# Patient Record
Sex: Female | Born: 1955 | Race: White | Hispanic: No | Smoking: Former smoker
Health system: Southern US, Community
[De-identification: ages and names within clinical notes are randomized; demographics above are authoritative.]

## PROBLEM LIST (undated history)

## (undated) DIAGNOSIS — F32A Depression, unspecified: Secondary | ICD-10-CM

## (undated) DIAGNOSIS — I8390 Asymptomatic varicose veins of unspecified lower extremity: Secondary | ICD-10-CM

## (undated) DIAGNOSIS — I10 Essential (primary) hypertension: Secondary | ICD-10-CM

## (undated) DIAGNOSIS — R569 Unspecified convulsions: Secondary | ICD-10-CM

## (undated) DIAGNOSIS — E785 Hyperlipidemia, unspecified: Secondary | ICD-10-CM

## (undated) DIAGNOSIS — F329 Major depressive disorder, single episode, unspecified: Secondary | ICD-10-CM

## (undated) DIAGNOSIS — E119 Type 2 diabetes mellitus without complications: Secondary | ICD-10-CM

## (undated) DIAGNOSIS — Z720 Tobacco use: Secondary | ICD-10-CM

## (undated) DIAGNOSIS — I639 Cerebral infarction, unspecified: Secondary | ICD-10-CM

## (undated) DIAGNOSIS — F419 Anxiety disorder, unspecified: Secondary | ICD-10-CM

## (undated) DIAGNOSIS — C539 Malignant neoplasm of cervix uteri, unspecified: Secondary | ICD-10-CM

## (undated) HISTORY — PX: PARTIAL HYSTERECTOMY: SHX80

## (undated) HISTORY — DX: Malignant neoplasm of cervix uteri, unspecified: C53.9

## (undated) HISTORY — DX: Type 2 diabetes mellitus without complications: E11.9

## (undated) HISTORY — DX: Anxiety disorder, unspecified: F41.9

## (undated) HISTORY — PX: ADENOIDECTOMY: SUR15

## (undated) HISTORY — PX: VEIN LIGATION AND STRIPPING: SHX2653

## (undated) HISTORY — PX: MOUTH SURGERY: SHX715

## (undated) HISTORY — DX: Essential (primary) hypertension: I10

## (undated) HISTORY — PX: TONSILLECTOMY: SUR1361

---

## 2002-10-21 ENCOUNTER — Encounter: Payer: Self-pay | Admitting: Pediatrics

## 2002-10-21 ENCOUNTER — Inpatient Hospital Stay (HOSPITAL_COMMUNITY): Admission: EM | Admit: 2002-10-21 | Discharge: 2002-10-27 | Payer: Self-pay

## 2002-10-27 ENCOUNTER — Inpatient Hospital Stay (HOSPITAL_COMMUNITY): Admission: AD | Admit: 2002-10-27 | Discharge: 2002-12-27 | Payer: Self-pay | Admitting: Neurology

## 2002-11-02 ENCOUNTER — Encounter: Payer: Self-pay | Admitting: Physical Medicine & Rehabilitation

## 2002-11-16 ENCOUNTER — Encounter: Payer: Self-pay | Admitting: Physical Medicine & Rehabilitation

## 2002-11-23 ENCOUNTER — Encounter: Payer: Self-pay | Admitting: Neurology

## 2002-11-24 ENCOUNTER — Encounter: Payer: Self-pay | Admitting: Physical Medicine & Rehabilitation

## 2002-11-29 ENCOUNTER — Encounter: Payer: Self-pay | Admitting: Neurology

## 2002-12-06 ENCOUNTER — Encounter: Payer: Self-pay | Admitting: Physical Medicine & Rehabilitation

## 2002-12-26 ENCOUNTER — Encounter: Payer: Self-pay | Admitting: Physical Medicine & Rehabilitation

## 2003-01-17 ENCOUNTER — Encounter (HOSPITAL_BASED_OUTPATIENT_CLINIC_OR_DEPARTMENT_OTHER): Payer: Self-pay | Admitting: Internal Medicine

## 2003-01-17 ENCOUNTER — Ambulatory Visit (HOSPITAL_COMMUNITY): Admission: RE | Admit: 2003-01-17 | Discharge: 2003-01-17 | Payer: Self-pay | Admitting: Internal Medicine

## 2012-02-28 ENCOUNTER — Inpatient Hospital Stay (HOSPITAL_COMMUNITY)
Admission: EM | Admit: 2012-02-28 | Discharge: 2012-03-01 | DRG: 069 | Disposition: A | Discharge status: Home / Self Care | Payer: PRIVATE HEALTH INSURANCE | Attending: Internal Medicine | Admitting: Internal Medicine

## 2012-02-28 ENCOUNTER — Encounter (HOSPITAL_COMMUNITY): Payer: Self-pay | Admitting: Emergency Medicine

## 2012-02-28 ENCOUNTER — Inpatient Hospital Stay (HOSPITAL_COMMUNITY): Payer: PRIVATE HEALTH INSURANCE

## 2012-02-28 ENCOUNTER — Emergency Department (HOSPITAL_COMMUNITY): Payer: PRIVATE HEALTH INSURANCE

## 2012-02-28 DIAGNOSIS — I8390 Asymptomatic varicose veins of unspecified lower extremity: Secondary | ICD-10-CM | POA: Insufficient documentation

## 2012-02-28 DIAGNOSIS — F3289 Other specified depressive episodes: Secondary | ICD-10-CM | POA: Diagnosis present

## 2012-02-28 DIAGNOSIS — F32A Depression, unspecified: Secondary | ICD-10-CM | POA: Diagnosis present

## 2012-02-28 DIAGNOSIS — I6340 Cerebral infarction due to embolism of unspecified cerebral artery: Secondary | ICD-10-CM

## 2012-02-28 DIAGNOSIS — F329 Major depressive disorder, single episode, unspecified: Secondary | ICD-10-CM | POA: Diagnosis present

## 2012-02-28 DIAGNOSIS — R531 Weakness: Secondary | ICD-10-CM

## 2012-02-28 DIAGNOSIS — I69998 Other sequelae following unspecified cerebrovascular disease: Secondary | ICD-10-CM

## 2012-02-28 DIAGNOSIS — IMO0002 Reserved for concepts with insufficient information to code with codable children: Secondary | ICD-10-CM | POA: Diagnosis present

## 2012-02-28 DIAGNOSIS — Z87891 Personal history of nicotine dependence: Secondary | ICD-10-CM

## 2012-02-28 DIAGNOSIS — E785 Hyperlipidemia, unspecified: Secondary | ICD-10-CM | POA: Diagnosis present

## 2012-02-28 DIAGNOSIS — R4789 Other speech disturbances: Secondary | ICD-10-CM

## 2012-02-28 DIAGNOSIS — E1165 Type 2 diabetes mellitus with hyperglycemia: Secondary | ICD-10-CM | POA: Diagnosis present

## 2012-02-28 DIAGNOSIS — E119 Type 2 diabetes mellitus without complications: Secondary | ICD-10-CM | POA: Diagnosis present

## 2012-02-28 DIAGNOSIS — G459 Transient cerebral ischemic attack, unspecified: Principal | ICD-10-CM | POA: Diagnosis present

## 2012-02-28 DIAGNOSIS — R209 Unspecified disturbances of skin sensation: Secondary | ICD-10-CM

## 2012-02-28 DIAGNOSIS — E7800 Pure hypercholesterolemia, unspecified: Secondary | ICD-10-CM | POA: Diagnosis present

## 2012-02-28 DIAGNOSIS — I639 Cerebral infarction, unspecified: Secondary | ICD-10-CM

## 2012-02-28 DIAGNOSIS — Z87898 Personal history of other specified conditions: Secondary | ICD-10-CM

## 2012-02-28 DIAGNOSIS — R197 Diarrhea, unspecified: Secondary | ICD-10-CM | POA: Diagnosis present

## 2012-02-28 DIAGNOSIS — E669 Obesity, unspecified: Secondary | ICD-10-CM | POA: Diagnosis present

## 2012-02-28 DIAGNOSIS — R29898 Other symptoms and signs involving the musculoskeletal system: Secondary | ICD-10-CM | POA: Diagnosis present

## 2012-02-28 DIAGNOSIS — R569 Unspecified convulsions: Secondary | ICD-10-CM | POA: Diagnosis present

## 2012-02-28 DIAGNOSIS — Z7902 Long term (current) use of antithrombotics/antiplatelets: Secondary | ICD-10-CM

## 2012-02-28 DIAGNOSIS — I10 Essential (primary) hypertension: Secondary | ICD-10-CM | POA: Diagnosis present

## 2012-02-28 HISTORY — DX: Asymptomatic varicose veins of unspecified lower extremity: I83.90

## 2012-02-28 HISTORY — DX: Tobacco use: Z72.0

## 2012-02-28 HISTORY — DX: Unspecified convulsions: R56.9

## 2012-02-28 HISTORY — DX: Cerebral infarction, unspecified: I63.9

## 2012-02-28 HISTORY — DX: Hyperlipidemia, unspecified: E78.5

## 2012-02-28 HISTORY — DX: Depression, unspecified: F32.A

## 2012-02-28 HISTORY — DX: Major depressive disorder, single episode, unspecified: F32.9

## 2012-02-28 LAB — COMPREHENSIVE METABOLIC PANEL
Alkaline Phosphatase: 120 U/L — ABNORMAL HIGH (ref 39–117)
BUN: 14 mg/dL (ref 6–23)
CO2: 24 mEq/L (ref 19–32)
Chloride: 98 mEq/L (ref 96–112)
Creatinine, Ser: 0.72 mg/dL (ref 0.50–1.10)
GFR calc Af Amer: >90 mL/min (ref >90)
GFR calc non Af Amer: >90 mL/min (ref >90)
Glucose, Bld: 142 mg/dL — ABNORMAL HIGH (ref 70–99)
Potassium: 3.4 mEq/L — ABNORMAL LOW (ref 3.5–5.1)
Total Bilirubin: 0.2 mg/dL — ABNORMAL LOW (ref 0.3–1.2)

## 2012-02-28 LAB — CBC
HCT: 41.1 % (ref 36.0–46.0)
Hemoglobin: 12.9 g/dL (ref 12.0–15.0)
MCHC: 31.4 g/dL (ref 30.0–36.0)
RBC: 4.78 MIL/uL (ref 3.87–5.11)

## 2012-02-28 LAB — POCT I-STAT, CHEM 8
BUN: 14 mg/dL (ref 6–23)
Creatinine, Ser: 0.7 mg/dL (ref 0.50–1.10)
Glucose, Bld: 146 mg/dL — ABNORMAL HIGH (ref 70–99)
Hemoglobin: 15 g/dL (ref 12.0–15.0)
TCO2: 26 mmol/L (ref 0–100)

## 2012-02-28 LAB — DIFFERENTIAL
Basophils Relative: 1 % (ref 0–1)
Lymphocytes Relative: 39 % (ref 12–46)
Lymphs Abs: 2.1 10*3/uL (ref 0.7–4.0)
Monocytes Absolute: 0.4 10*3/uL (ref 0.1–1.0)
Monocytes Relative: 8 % (ref 3–12)
Neutro Abs: 2.6 10*3/uL (ref 1.7–7.7)

## 2012-02-28 LAB — CARDIAC PANEL(CRET KIN+CKTOT+MB+TROPI)
CK, MB: 1.7 ng/mL (ref 0.3–4.0)
Total CK: 111 U/L (ref 7–177)

## 2012-02-28 LAB — CK TOTAL AND CKMB (NOT AT ARMC)
CK, MB: 1.7 ng/mL (ref 0.3–4.0)
Total CK: 120 U/L (ref 7–177)

## 2012-02-28 LAB — APTT: aPTT: 23 seconds — ABNORMAL LOW (ref 24–37)

## 2012-02-28 MED ORDER — CLOPIDOGREL BISULFATE 75 MG PO TABS
75.0000 mg | ORAL_TABLET | Freq: Every day | ORAL | Status: DC
  Administered 2012-02-29 – 2012-03-01 (×2): 75 mg via ORAL
  Filled 2012-02-28 (×3): qty 1

## 2012-02-28 MED ORDER — ENOXAPARIN SODIUM 40 MG/0.4ML ~~LOC~~ SOLN
40.0000 mg | SUBCUTANEOUS | Status: DC
  Administered 2012-02-28 – 2012-02-29 (×2): 40 mg via SUBCUTANEOUS
  Filled 2012-02-28 (×3): qty 0.4

## 2012-02-28 MED ORDER — ATORVASTATIN CALCIUM 40 MG PO TABS
40.0000 mg | ORAL_TABLET | Freq: Every day | ORAL | Status: DC
  Administered 2012-02-28: 40 mg via ORAL
  Filled 2012-02-28 (×2): qty 1

## 2012-02-28 MED ORDER — ZONISAMIDE 100 MG PO CAPS
200.0000 mg | ORAL_CAPSULE | Freq: Every day | ORAL | Status: DC
  Administered 2012-02-28 – 2012-02-29 (×2): 200 mg via ORAL
  Filled 2012-02-28 (×3): qty 2

## 2012-02-28 MED ORDER — SENNOSIDES-DOCUSATE SODIUM 8.6-50 MG PO TABS: 1.0000 | ORAL_TABLET | Freq: Every evening | ORAL | Status: DC | PRN

## 2012-02-28 MED ORDER — VENLAFAXINE HCL 75 MG PO TABS
75.0000 mg | ORAL_TABLET | Freq: Two times a day (BID) | ORAL | Status: DC
  Administered 2012-02-28 – 2012-03-01 (×4): 75 mg via ORAL
  Filled 2012-02-28 (×5): qty 1

## 2012-02-28 MED ORDER — ASPIRIN EC 81 MG PO TBEC
81.0000 mg | DELAYED_RELEASE_TABLET | Freq: Every day | ORAL | Status: DC
  Administered 2012-02-28 – 2012-02-29 (×2): 81 mg via ORAL
  Filled 2012-02-28 (×2): qty 1

## 2012-02-28 NOTE — Code Documentation (Signed)
Code stroke called at 1148, Patient arrived to [facility] via EMS at 1154, EDP seen at 1154, stroke team arrived at 1150, Patient in CT scan at 1156 lab at 1150.  As per daughter at bedside, she spoke to her at about 2200 last night, Patient woke up today around 0600 and fell while trying to get out of bed.  Patients husband called daughter who arrived to their home and realized that she had more of a slurred speech and more weakness.  Daughter called EMS NIHSS 7.  Cancelled at 1218

## 2012-02-28 NOTE — ED Notes (Signed)
Patient awaiting admit bed

## 2012-02-28 NOTE — Evaluation (Signed)
Clinical/Bedside Swallow Evaluation Patient Details  Name: Andrea Cortez MRN: [id number] Date of Birth: [birth year]  Today's Date: 02/28/2012 Time: 1600-1630 SLP Time Calculation (min): 30 min  Past Medical History:  Past Medical History  Diagnosis Date  . Stroke 2004 x 2, 2010 x 1    left slightly hemorrhagic left temporal and parietal infarct, new acute right medial thalamus, and left anterior pons --> had extended rehabilitation course // Continues to have residual right UE weakness  . Hyperlipidemia   . Depression     2/2 stroke in 2004  . Seizure 2004 after stroke    developed 2/2 stroke in 2004  . Tobacco abuse 1.5 PPD x 38 years    smoking since 6th grade; quit 2004  . Varicose vein     B/L LE   Past Surgical History:  Past Surgical History  Procedure Date  . Partial hysterectomy 1998/1999    uterus removed 2/2 cervical cancer  . Tonsillectomy     as a child   HPI:  56 y/o F admitted to [facility] ED with stroke like symptoms. Patient referred for BSE as she failed RN [employer] Screen.    Assessment / Plan / Recommendation Clinical Impression   Minimal oropharyngeal dysphagia marked by decreased sensory affecting initiation of swallow with thin liquids with intermittent s/s of penetration with larger sips . Symptoms present during evaluation more esophageal based. Recommend to proceed with regular consistency and thin liquids with strict aspiration and reflux precautions. Patient to benefit from GI consult to assess esophageal functioning. ST warranted in acute care setting for diet tolerance and to provide education to patient and caregivers on strategies to decrease risk of aspiration. Recommend full supervision with all meals as patient observed to be impulsive with decreased safety awareness.     Aspiration Risk    Moderate   Diet Recommendation Regular;Thin liquid  Full supervision with all meals Small bites and sips Effortful swallow with head down position with  liquids May have straws Eat at slow rate Clear throat intermittently throughout meal   Other  Recommendations Consider GI evaluation   Follow Up Recommendations   No further ST treatment at next level of care   Frequency and Duration min 2x/week  2 weeks        SLP Swallow Goals Patient will consume recommended diet without observed clinical signs of aspiration with: Modified independent assistance Patient will utilize recommended strategies during swallow to increase swallowing safety with: Modified independent assistance   Swallow Study Prior Functional Status   Reports of coughing with soup and reports of solids "sticking"    General Date of Onset: 02/28/12 HPI: 56 y/o F admitted to [facility] ED with stroke like symptoms. Patient referred for BSE as she failed RN [employer] Screen.  Type of Study: Bedside swallow evaluation Previous Swallow Assessment: 2004 and 2011 Diet Prior to this Study: NPO Temperature Spikes Noted: No Respiratory Status: Room air History of Intubation: No Behavior/Cognition: Alert;Cooperative;Impulsive Oral Cavity - Dentition: Dentures, top;Dentures, bottom Vision: Functional for self-feeding Patient Positioning: Upright in bed Baseline Vocal Quality: Clear Volitional Cough: Strong Volitional Swallow: Able to elicit    Oral/Motor/Sensory Function Overall Oral Motor/Sensory Function: Impaired Labial ROM: Reduced left Labial Symmetry: Abnormal symmetry left Labial Strength: Reduced Lingual ROM: Reduced left Lingual Symmetry: Abnormal symmetry left Lingual Strength: Reduced Lingual Sensation: Reduced Facial ROM: Reduced left Facial Symmetry: Left droop Facial Strength: Reduced Facial Sensation: Reduced   Ice Chips Ice chips: Within functional limits Presentation: Spoon  Thin Liquid Thin Liquid: Impaired Presentation: Cup;Straw Pharyngeal  Phase Impairments: Delayed Swallow;Decreased hyoid-laryngeal movement;Cough - Delayed    Nectar Thick  Nectar Thick Liquid: Not tested   Honey Thick Honey Thick Liquid: Not tested   Puree Puree: Impaired Pharyngeal Phase Impairments: Delayed Swallow;Decreased hyoid-laryngeal movement   Solid Solid: Impaired Pharyngeal Phase Impairments: Delayed Swallow;Decreased hyoid-laryngeal movement;Throat Clearing - Delayed   Moreen Fowler MS, CCC-SLP [phone] [facility] 02/28/2012,7:29 PM

## 2012-02-28 NOTE — H&P (Signed)
This is a Psychologist, occupational Note.  The care of the patient was discussed with Dr. Quentin Ore and the assessment and plan was formulated with their assistance.  Please see their note below for official documentation of the patient encounter.     Date: 02/28/2012  Patient name: Andrea Cortez Medical record number: [id number] Date of birth: 1956-01-10 Age: 56 y.o. Gender: female PCP: No primary provider on file.  Medical Service: Internal Medicine Teaching Service  Attending physician: Dr. Blanch Media     Internal Medicine Teaching Service Contact Information  1st Contact:  Quentin Ore Pager: [phone] 2nd Contact:  Johnette Abraham Pager: [phone] After 5 pm or weekends: 1st Contact:      Pager: [phone] 2nd Contact:      Pager: [phone]  Chief Complaint: R sided weakness / slurred speech  History of Present Illness: Patient is a 56 yo woman with PMH of stroke (2004 x 2 and 2011 x 1) with residual right face reduced sensation, right arm weakness and reduced sensation and right leg weakness and reduced sensation for which she uses a cane to ambulate at home (has wheelchair but does not use it). At baseline she usually has some dysarthria and right sided facial droop. She was in her usual state of health until last night when she went to bed and upon awaking this morning from her dog barking she attempted to get out of bed. Her usual routine is to swing her legs off the side of the bed and wait a second before standing up. She does not note that her legs felt any different this morning as she swung them off the bed, however her memory is somewhat impaired about this morning. Upon standing up her legs immediately gave out from under her and she face-planted to the floor. Her husband was immediately at her side, however she told him to leave her be and finally after about 30 minutes he and their daughter picked her up off the floor. The daughter noticed that her speech was worse than  usual and that there was more pronounced facial droop and called EMS. She noted some blurry vision that has resolved almost completely. She had some dizziness after her fall and currently in the ED she is not dizzy however she says she knows she would be if she were to stand. She also admits to having constant diarrhea at least two loose, brown movements a day for "a long time". Notably, she has been up and used the bathroom with assistance while in the ED. She denies any hearing or olfactory loss, nausea, coughs, fevers, chills, or night sweats.  Meds: Home meds are unclear as to what she is actually taking due to inconsistenices in prescription refills as well as her daughter reporting that she finds pills lying around the house. But list includes: Fish oil daily Baby ASA daily Plavix 75 mg BID Zonisamide 100 mg qhs Venlafaxine 75 mg BID Niacin (flush free) 500 mg daily Torsemide 20 mg daily (has been on it for 3 months, then will switch back to lasix 20 mg daily) Vitamin B12 shot monthly (last dose last week) Vitamin B12 tablet 500 mcg daily Crestor 20 mg daily Fenofibrate 160 mg daily Suphedrine PE (chlorpheniramine 4mg /phenylephine 10 mg) BIDPRN  Allergies: Allergies as of 02/28/2012 - Review Complete 02/28/2012  Allergen Reaction Noted  . Iron Swelling 02/28/2012  . Ivp dye (iodinated diagnostic agents) Swelling/ itching 02/28/2012  . Niacin and related Extreme flushing 02/28/2012   Past Medical History  Diagnosis Date  . Stroke 2004 x 2, 2010    left slightly hemorrhagic left temporal and parietal infarct (left MCA), new acute right medial thalamus, and left anterior pons --> had extended rehabilitation course // Continues to have residual right UE weakness  . Hyperlipidemia   . Depression     2/2 stroke in 2004  . Seizure 2004 after stroke    developed 2/2 stroke in 2004  . Tobacco abuse 1.5 PPD x 38 years    smoking since 6th grade; quit 2004   Past Surgical History    Procedure Date  . Partial hysterectomy 1998/1999    uterus removed 2/2 cervical cancer  . Tonsillectomy     as a child   Family History  Problem Relation Age of Onset  . Cancer Mother     lung cancer, died at 56  . Lung disease Father     1 lung removed, death (u/k cause)  . Heart attack Brother     massive MI causing death  . HIV Brother     died from AIDS  . Emphysema Brother     alive  . Other Daughter     hypochondriac   History   Social History  . Marital Status: Married    Spouse Name: N/A    Number of Children: N/A  . Years of Education: Graduated college with degree in accounting   Occupational History  . Accountant--on diasbility since 2004    Social History Main Topics  . Smoking status: Former Smoker -- 1.5 packs/day for 38 years    Types: Cigarettes    Quit date: 01/24/2012  . Smokeless tobacco: Never Used   Comment: started smoking in 6th grade  . Alcohol Use: No  . Drug Use: No  . Sexually Active: Not on file   Other Topics Concern  . Not on file   Social History Narrative  . No narrative on file    Review of Systems: Pertinent items are noted in HPI.  Physical Exam: Blood pressure 130/84, pulse 96, temperature 97.6 F (36.4 C), temperature source Oral, resp. rate 15, SpO2 95.00%. General appearance: alert, cooperative, no distress and slowed mentation Head: Normocephalic, without obvious abnormality, atraumatic Eyes: conjunctivae/corneas clear. PERRL, EOM's intact. Fundi benign. Ears: normal TM's and external ear canals both ears Nose: Nares normal. Septum midline. Mucosa normal. No drainage or sinus tenderness. Throat: lips, mucosa, and tongue normal; teeth and gums normal Neck: no adenopathy, no carotid bruit, no JVD, supple, symmetrical, trachea midline and thyroid not enlarged, symmetric, no tenderness/mass/nodules Back: symmetric, no curvature. ROM normal. No CVA tenderness. Lungs: rales RLL Heart: regular rate and rhythm, S1, S2  normal, no murmur, click, rub or gallop Abdomen: soft, non-tender; bowel sounds normal; no masses,  no organomegaly Extremities: edema some edema B/L LE and varicose veins noted; notable R arm, R leg weakness Pulses: 2+ and symmetric Skin: Skin color, texture, turgor normal. No rashes or lesions; notable R arm/R leg decreased sensation to touch Neurologic: Mental status: Alert, oriented, thought content appropriate Cranial nerves:  I: smell Not tested  II: visual acuity  OS: blurry    OD: blurry  II: visual fields Full to confrontation  II: pupils Equal, round, reactive to light  III,VII: ptosis None  III,IV,VI: extraocular muscles  Full ROM  V: mastication Normal  V: facial light touch sensation  Normal  V,VII: corneal reflex  Present  VII: facial muscle function - upper  R 3/5, L 5/5  VII: facial  muscle function - lower R 3/5, L 5/5  VIII: hearing Not tested  IX: soft palate elevation  Normal  IX,X: gag reflex Not tested  XI: trapezius strength  R 3/5, L 5/5  XI: sternocleidomastoid strength 5/5  XI: neck flexion strength  5/5  XII: tongue strength  Normal   Lab results: Basic Metabolic Panel:  Basename 02/28/12 1207 02/28/12 1158  NA 139 136  K 4.0 3.4*  CL 100 98  CO2 -- 24  GLUCOSE 146* 142*  BUN 14 14  CREATININE 0.70 0.72  CALCIUM -- 9.3  MG -- --  PHOS -- --   Liver Function Tests:  Basename 02/28/12 1158  AST 29  ALT 23  ALKPHOS 120*  BILITOT 0.2*  PROT 7.5  ALBUMIN 3.9   CBC:  Basename 02/28/12 1207 02/28/12 1158  WBC -- 5.4  NEUTROABS -- 2.6  HGB 15.0 12.9  HCT 44.0 41.1  MCV -- 86.0  PLT -- 312   Cardiac Enzymes:  Basename 02/28/12 1158  CKTOTAL 120  CKMB 1.7  CKMBINDEX --  TROPONINI <0.30   CBG:  Basename 02/28/12 1226  GLUCAP 132*  Coagulation:  Basename 02/28/12 1158  LABPROT 12.8  INR 0.94   Imaging results:  Ct Head Wo Contrast  02/28/2012  *RADIOLOGY REPORT*  Clinical Data: Left-sided weakness and slurred speech.  CT  HEAD WITHOUT CONTRAST  Technique:  Contiguous axial images were obtained from the base of the skull through the vertex without contrast.  Comparison: 07/03/2011  Findings: There is no acute intracranial hemorrhage, infarction, or mass lesion.  Old stroke in the right thalamus as well as an extensive old stroke in the left middle cerebral artery distribution with secondary encephalomalacia.  No acute abnormalities.  No osseous abnormalities of significance.  IMPRESSION: Old strokes.  No acute abnormalities.  Original Report Authenticated By: Gwynn Burly, M.D.   Mr Angiogram Head Wo Contrast  02/28/2012  *RADIOLOGY REPORT*  Clinical Data:  A aphasia.  Weakness.  MRI HEAD WITHOUT CONTRAST MRA HEAD WITHOUT CONTRAST  Technique:  Multiplanar, multiecho pulse sequences of the brain and surrounding structures were obtained without intravenous contrast. Angiographic images of the head were obtained using MRA technique without contrast.  Comparison:  Head CT same day.  Previous MRI 08/13/2009.  MRI HEAD  Findings:  Diffusion imaging does not show any acute or subacute infarction.  The brainstem is normal except for Wallerian degeneration on the left.  No focal cerebellar insult.  The cerebral hemispheres show an old lacunar infarction in the right thalamus, chronic small vessel changes within the deep white matter and an old left middle cerebral artery territory stroke that has progressed to atrophy encephalomalacia with adjacent gliosis.  No mass lesion, hemorrhage, hydrocephalus or extra-axial collection. There is flow in the major vessels at the base of the brain.  No pituitary mass.  There is mild mucosal inflammation of the right maxillary sinus.  IMPRESSION: No acute infarction.  Old left MCA territory stroke.  Chronic white matter small vessel disease.  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  MRA HEAD  Findings: Both internal carotid arteries are widely patent into the brain.  The anterior and middle cerebral vessels are  patent without proximal stenosis, aneurysm or vascular malformation. There is a missing MCA branch on the left consistent with the old infarction in that region.  Both vertebral arteries are patent to the basilar. No basilar stenosis.  Posterior circulation branch vessels appear normal.  IMPRESSION: Normal intracranial MR angiography of the  large and medium-sized vessels with exception that there is a missing the left MCA branch consistent with the old infarction in that distribution.  Original Report Authenticated By: Thomasenia Sales, M.D.   Mr Brain Wo Contrast  02/28/2012  *RADIOLOGY REPORT*  Clinical Data:  A aphasia.  Weakness.  MRI HEAD WITHOUT CONTRAST MRA HEAD WITHOUT CONTRAST  Technique:  Multiplanar, multiecho pulse sequences of the brain and surrounding structures were obtained without intravenous contrast. Angiographic images of the head were obtained using MRA technique without contrast.  Comparison:  Head CT same day.  Previous MRI 08/13/2009.  MRI HEAD  Findings:  Diffusion imaging does not show any acute or subacute infarction.  The brainstem is normal except for Wallerian degeneration on the left.  No focal cerebellar insult.  The cerebral hemispheres show an old lacunar infarction in the right thalamus, chronic small vessel changes within the deep white matter and an old left middle cerebral artery territory stroke that has progressed to atrophy encephalomalacia with adjacent gliosis.  No mass lesion, hemorrhage, hydrocephalus or extra-axial collection. There is flow in the major vessels at the base of the brain.  No pituitary mass.  There is mild mucosal inflammation of the right maxillary sinus.  IMPRESSION: No acute infarction.  Old left MCA territory stroke.  Chronic white matter small vessel disease.  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  MRA HEAD  Findings: Both internal carotid arteries are widely patent into the brain.  The anterior and middle cerebral vessels are patent without proximal stenosis,  aneurysm or vascular malformation. There is a missing MCA branch on the left consistent with the old infarction in that region.  Both vertebral arteries are patent to the basilar. No basilar stenosis.  Posterior circulation branch vessels appear normal.  IMPRESSION: Normal intracranial MR angiography of the large and medium-sized vessels with exception that there is a missing the left MCA branch consistent with the old infarction in that distribution.  Original Report Authenticated By: Thomasenia Sales, M.D.    Other results: EKG: some left axis deviation, widened QRS complexes.  Assessment & Plan by Problem:  Patient is a 56 yo woman with PMH of 3 strokes and poor prescription compliance due to memory loss who awoke this morning with increased dysarthria and B/L LE weakness causing her to fall. Her symptoms have slowly resolved throughout the day. She has more feeling back in her right face, right arm, and can walk on her feet with assistance to the bathroom.    Dysarthria / weakness - Patient has a history of 3 strokes in the past (2004 x 2 and 2011 x 1). She has inconsistent use of her medications as well as risk factors for subsequent strokes including (hypertension, dyslipidemia, subjective left carotid athlerosclerosis). Differential includes TIA (resolving right facial weakness and sensory loss, resolving B/L LE weakness from this morning), stroke (given her previous history of left MCA and right thalamus strokes with residual deficits), afib (ischemic stroke 2/2 clot emboli), orthostatic hypotension (unlikely given that she suffered neuro focal deficits after her episode). - CT scan head - MRI head - seizure/fall precautions   - carotid dopplers - CXR - PT/OT eval - continue ASA, plavix  Hyperlipidemia - Patient has a history of poor lipids for which she was supposed to be taking fenofibrate, crestor, and niacin at home with unknown compliance. -lipid panel in am -carotid  dopplers -lipitor 40 mg daily  Seizures - Patient developed seizures after her first stroke in 2004. She has since  not had any seizures as she has also been taking zonisamide daily since then. She has no other history of sz d/o nor any family history of such. -continue zonisamide 200 mg daily  Depression - Developed after her first stroke in 2004. Has been stable and well managed on her home regimen. - continue venlafaxine 75 mg BID  Vitamin B12 deficiency - HgB = 12.9, MCV = 86. She has a history of B12 deficiency, etiology u/k. She takes daily tablets and monthly injection of B12. - hold vitamin B12 for now  DVT ppx - Lovenox  This is a Psychologist, occupational Note.  The care of the patient was discussed with Dr. Quentin Ore and the assessment and plan was formulated with their assistance.  Please see their note for official documentation of the patient encounter.   SignedLewie Chamber 02/28/2012, 4:32 PM     Internal medicine teaching service resident history and physical Hospital Admission Note Date: 02/28/2012  Patient name: Andrea Cortez Medical record number: [id number] Date of birth: May 14, 1956 Age: 56 y.o. Gender: female  Chief Complaint:  Chief Complaint  Patient presents with  . Aphasia  . Weakness     History of Present Illness: Gertha Lichtenberg is a 56 y.o.female with past medical history significant for stroke (x3) with large right sided motor and sensory deficits, baseline dysarthria,  and hypercholesterolemia who presents with fall, aphasia and new sensory deficit for since 6:30 AM.    Tami Ribas states that she was in her usual state of health until this morning. She awoke at 6:30 AM and set up at the side of her bed. She states she was feeling per her baseline this morning. However, when she got up out of bed, she stood up and immediately fell down face first. She denies loss of consciousness or hitting her head. She told her husband to help her up and she laid  there for approximately 30 minutes. She states she felt lightheaded with increased weakness right greater than left. She also endorsed blurry vision which has since returned to baseline. She states her weakness and right-sided sensory deficits are now improving left greater than right. She endorses persistent right leg numbness and right face numbness. She states her speech is very slow and slurred above baseline which is corroborated by her daughter who is at the bedside. She states she has to think a lot about her word choice. Speech is improved from this morning though still not her good. She denies difficulty swallowing up of baseline dysphagia. Pertinent negatives include symptoms of presyncope such as lightheadedness, chest pain, or shortness of breath. She uses a cane to walk at baseline and has a wheelchair though she does not use it. She also has very severe right-sided deficits from a prior stroke in 2004. She states she often injures her right arm in which she has only limited feeling.  She states her blood pressures have been steadily increasing from 120 to the 130s-40s. She is also having increased fluid and weight gain but cannot quantify how much. She states she has baseline memory problems and cannot remember much day-to-day or even much during the day. She does have difficulty remembering if she takes her medicines, but she does use a pill box. Her daughter states that she often finds pills scattered around the house. Review of the patient's pharmacy records shows that her fibrate has not been filled in a long time.  Review of Systems: Bold Items are Positive Constitutional: Fever, chills, fatigue.  HEENT: blurry vision (uses glasses and is at baseline),  trouble swallowing (denies above baseline),and tinnitus.   Respiratory: SOB, cough, chest tightness,  and wheezing.   Cardiovascular: Chest pain, palpitations and leg swelling.  Gastrointestinal: Nausea, vomiting, abdominal pain, diarrhea,  constipation, blood in stool  Genitourinary: Dysuria, urgency, frequency, hematuria, difficulty urinating.  Musculoskeletal: Myalgias, back pain, joint swelling, arthralgias and gait problem.  Skin: Pallor, rash and wound (endorses old wounds from injuries to R arm.  Neurological: Dizziness, seizures (history of), syncope, weakness, light-headedness, numbness and headaches.  Psychiatric/Behavioral:  confusion, depression  Past Medical History  Diagnosis Date  . Stroke 2004 x 2, 2010    left slightly hemorrhagic left temporal and parietal infarct, new acute right medial thalamus, and left anterior pons --> had extended rehabilitation course // Continues to have residual right UE weakness  . Hyperlipidemia   . Depression     2/2 stroke in 2004  . Seizure 2004 after stroke    developed 2/2 stroke in 2004  . Tobacco abuse 1.5 PPD x 38 years    smoking since 6th grade; quit 2004    Meds: Prescriptions prior to admission  Medication Sig Dispense Refill  . aspirin EC 81 MG tablet Take 81 mg by mouth daily.      . B-12, METHYLCOBALAMIN, SL Place 500 mcg under the tongue daily.      . carbamazepine (TEGRETOL) 200 MG tablet Take 200 mg by mouth 2 (two) times daily.      . clopidogrel (PLAVIX) 75 MG tablet Take 75 mg by mouth 2 (two) times daily.      . niacin 500 MG tablet Take 500 mg by mouth daily with breakfast.      . OMEGA 3 1200 MG CAPS Take 1 capsule by mouth daily.      . rosuvastatin (CRESTOR) 20 MG tablet Take 20 mg by mouth every evening.      . venlafaxine (EFFEXOR) 75 MG tablet Take 75 mg by mouth 2 (two) times daily.       Marland Kitchen zonisamide (ZONEGRAN) 100 MG capsule Take 200 mg by mouth at bedtime.         Allergies: Iron; Ivp dye; and Niacin and related  Please see medical student note for family history and social history.  Physical Exam: Blood pressure 142/68, pulse 73, resp. rate 14, SpO2 93.00%. Gen: Obese woman in no acute distress; slurring her words with slow speech  alert, appropriate and cooperative throughout examination. Head: Normocephalic, atraumatic. Eyes: PERRL, EOMI Throat: Oropharynx nonerythematous, no exudate appreciated. Poor dentition with upper dentures  Neck: Supple with no deformities, masses, or tenderness noted.  No JVD. Possible left carotid bruit.  Lungs: Normal respiratory effort. Left-sided inspiratory crackles otherwise Clear to auscultation BL, without crackles or wheezes. Heart: Regular rate and rhythm S1 and S2 normal without  murmur, gallop,or rubs. Abdomen: BS normoactive. Soft, nondistended, non-tender. No masses or organomegaly. Extremities: No pretibial edema.left wrist has nonpitting edema and numerous scars consistent with prior injuries  Neurologic: A&O X3, CN II - XII are grossly intact, with the exception of the right facial nerve and accessory nerves. The patient has right-sided facial droop most obvious with smile and diminished shoulder shrug, right arm sensation decreased. Right upper extremity 4-5 biceps and triceps strength 1/5 grip strength. Left upper extremity normal strength. Reflexes normal and equal at greater radialis biceps and patella reflexes bilaterally. Babinski test positive in the right foot negative in the left. Lower extremity sensation intact. Patient  can ambulate with assistance. Skin: Numerous scars in the right arm Psych: mood and affect are normal.    Lab results: Basic Metabolic Panel:  Basename 02/28/12 1207 02/28/12 1158  NA 139 136  K 4.0 3.4*  CL 100 98  CO2 -- 24  GLUCOSE 146* 142*  BUN 14 14  CREATININE 0.70 0.72  CALCIUM -- 9.3  MG -- --  PHOS -- --   Liver Function Tests:  Basename 02/28/12 1158  AST 29  ALT 23  ALKPHOS 120*  BILITOT 0.2*  PROT 7.5  ALBUMIN 3.9   CBC:  Basename 02/28/12 1207 02/28/12 1158  WBC -- 5.4  NEUTROABS -- 2.6  HGB 15.0 12.9  HCT 44.0 41.1  MCV -- 86.0  PLT -- 312   Cardiac Enzymes:  Basename 02/28/12 1158  CKTOTAL 120  CKMB 1.7    CKMBINDEX --  TROPONINI <0.30    CBG:  Basename 02/28/12 1226  GLUCAP 132*   Coagulation:  Basename 02/28/12 1158  LABPROT 12.8  INR 0.94  Imaging results:  Ct Head Wo Contrast  02/28/2012  *RADIOLOGY REPORT*  Clinical Data: Left-sided weakness and slurred speech.  CT HEAD WITHOUT CONTRAST  Technique:  Contiguous axial images were obtained from the base of the skull through the vertex without contrast.  Comparison: 07/03/2011  Findings: There is no acute intracranial hemorrhage, infarction, or mass lesion.  Old stroke in the right thalamus as well as an extensive old stroke in the left middle cerebral artery distribution with secondary encephalomalacia.  No acute abnormalities.  No osseous abnormalities of significance.  IMPRESSION: Old strokes.  No acute abnormalities.  Original Report Authenticated By: Gwynn Burly, M.D.   Mr Brain Wo Contrast and Mr Angiogram Head Wo Contrast  02/28/2012  *RADIOLOGY REPORT*  Clinical Data:  A aphasia.  Weakness.  MRI HEAD WITHOUT CONTRAST MRA HEAD WITHOUT CONTRAST  Technique:  Multiplanar, multiecho pulse sequences of the brain and surrounding structures were obtained without intravenous contrast. Angiographic images of the head were obtained using MRA technique without contrast.  Comparison:  Head CT same day.  Previous MRI 08/13/2009.  MRI HEAD  Findings:  Diffusion imaging does not show any acute or subacute infarction.  The brainstem is normal except for Wallerian degeneration on the left.  No focal cerebellar insult.  The cerebral hemispheres show an old lacunar infarction in the right thalamus, chronic small vessel changes within the deep white matter and an old left middle cerebral artery territory stroke that has progressed to atrophy encephalomalacia with adjacent gliosis.  No mass lesion, hemorrhage, hydrocephalus or extra-axial collection. There is flow in the major vessels at the base of the brain.  No pituitary mass.  There is mild mucosal  inflammation of the right maxillary sinus.  IMPRESSION: No acute infarction.  Old left MCA territory stroke.  Chronic white matter small vessel disease.  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  MRA HEAD  Findings: Both internal carotid arteries are widely patent into the brain.  The anterior and middle cerebral vessels are patent without proximal stenosis, aneurysm or vascular malformation. There is a missing MCA branch on the left consistent with the old infarction in that region.  Both vertebral arteries are patent to the basilar. No basilar stenosis.  Posterior circulation branch vessels appear normal.  IMPRESSION: Normal intracranial MR angiography of the large and medium-sized vessels with exception that there is a missing the left MCA branch consistent with the old infarction in that distribution.  Original Report Authenticated By:  Thomasenia Sales, M.D.   Other results: EKG: normal sinus rhythm, left axis deviation. New T-wave inversions in leads V2 through V5. Otherwise unchanged from previous tracing  Assessment & Plan by Problem:  TIA versus stroke - the patient has new neurologic deficits consistent with stroke/TIA. CT and MRI both ruled out acute hemorrhage or infarction, making the diagnosis of TIA most likely. The patient is slightly orthostatic, however this would be unlikely to cause her neurologic deficits. Will continue to risk stratify given very concerning past medical history of stroke. Regardless of etiology of neurologic deficit, the patient will require better management as she states she can not really remember if she is taking her medicines. Her daughter who is a CNA in training to be an EMT is going to move in with the patient and it is suggested that this daughter manage medicines.  -- Admit to telemetry with fall and seizure precautions -- Heart healthy diet once swallow studies performed --Hemoglobin A1c fasting lipid panel -- PT consult OT consult -- Carotid Dopplers -- 2-D echo --  Chest x-ray -- Continue current antiplatelet therapy of Plavix 75 mg daily and aspirin 81 mg daily -- Will titrate antihypertensive medications -- PT, OT, and speech consults  Hyperlipidemia - unclear control at this time, though given her history would desire goal under 70 for LDL -- Continue atorvastatin 40 mg daily  History of seizures -patient has not had a seizure episode since when she had her first series of strokes. We will continue her on her home anti-seizure regimen -- zonisamide 200 mg QHS  Depression - stable, we will continue venlafaxine  DVT prophylaxis Lovenox   Signed: Coleston Dirosa 02/28/2012, 3:06 PM

## 2012-02-28 NOTE — ED Notes (Signed)
Pt insistent that she ambulate to br.  Daughter at side and states that this is normal and she will walk her.  Pt ambulates well with minimal assist

## 2012-02-28 NOTE — ED Notes (Signed)
Patient transported to MRI 

## 2012-02-28 NOTE — ED Provider Notes (Signed)
History     CSN: [id number]  Arrival date & time 02/28/12  1154   First MD Initiated Contact with Patient 02/28/12 1203      Chief Complaint  Patient presents with  . Aphasia  . Weakness    HPI Pt went to bed at 1030 pm last night.  That was the last time she was known to be normal.  This morning at 0630 she woke up and fell out of her bed.  Pt has trouble with her speech normally but this am her speech is worse and she is having increasing weakness in her right side. Patient has history of a prior stroke with resulting partial right hemiparesis associated with speech difficulty. Patient is also having trouble with her vision today feels.  Family tried to contact her doctor they were instructed to bring her to the hospital. EMS transported the patient and initially a code stroke was activated. When we are able to get additional information that in fact the last time she was noted to be normal was last evening the code stroke was canceled. Patient has not had any other trouble with fevers or vomiting Past Medical History  Diagnosis Date  . Stroke     No past surgical history on file.  No family history on file.  History  Substance Use Topics  . Smoking status: Not on file  . Smokeless tobacco: Not on file  . Alcohol Use:     OB History    Grav Para Term Preterm Abortions TAB SAB Ect Mult Living                  Review of Systems  All other systems reviewed and are negative.    Allergies  Iron; Ivp dye; and Niacin and related  Home Medications   Current Outpatient Rx  Name Route Sig Dispense Refill  . ASPIRIN EC 81 MG PO TBEC Oral Take 81 mg by mouth daily.    . B-12 (METHYLCOBALAMIN) SL Sublingual Place 500 mcg under the tongue daily.    Marland Kitchen CLOPIDOGREL BISULFATE 75 MG PO TABS Oral Take 75 mg by mouth 2 (two) times daily.    . CYANOCOBALAMIN 1000 MCG/ML IJ SOLN Intramuscular Inject 1,000 mcg into the muscle every 30 (thirty) days.    . CYCLOBENZAPRINE HCL 5 MG PO  TABS Oral Take 5 mg by mouth 3 (three) times daily as needed. For pain    . FENOFIBRATE 160 MG PO TABS Oral Take 160 mg by mouth daily.    . FUROSEMIDE 20 MG PO TABS Oral Take 20 mg by mouth daily.    Marland Kitchen NIACIN 500 MG PO TABS Oral Take 500 mg by mouth daily with breakfast.    . OMEGA 3 1200 MG PO CAPS Oral Take 1 capsule by mouth daily.    Marland Kitchen ROSUVASTATIN CALCIUM 20 MG PO TABS Oral Take 20 mg by mouth every evening.    . TORSEMIDE 20 MG PO TABS Oral Take 20 mg by mouth daily.    . VENLAFAXINE HCL 75 MG PO TABS Oral Take 75 mg by mouth daily.    Marland Kitchen ZONISAMIDE 100 MG PO CAPS Oral Take 100 mg by mouth at bedtime.      BP 142/128  Pulse 75  Resp 17  SpO2 95%  Physical Exam  Nursing note and vitals reviewed. Constitutional: She is oriented to person, place, and time. She appears well-developed and well-nourished. No distress.  HENT:  Head: Normocephalic and atraumatic.  Right  Ear: External ear normal.  Left Ear: External ear normal.  Mouth/Throat: Oropharynx is clear and moist.  Eyes: Conjunctivae are normal. Right eye exhibits no discharge. Left eye exhibits no discharge. No scleral icterus.  Neck: Neck supple. No tracheal deviation present.  Cardiovascular: Normal rate, regular rhythm and intact distal pulses.   Pulmonary/Chest: Effort normal and breath sounds normal. No stridor. No respiratory distress. She has no wheezes. She has no rales.  Abdominal: Soft. Bowel sounds are normal. She exhibits no distension. There is no tenderness. There is no rebound and no guarding.  Musculoskeletal: She exhibits no edema and no tenderness.  Neurological: She is alert and oriented to person, place, and time. A cranial nerve deficit ( no gross defecits noted) and sensory deficit is present. She exhibits normal muscle tone. She displays no seizure activity. Coordination normal.       Partial right have a paracentesis, aphasia with slurred speech, decreased sensation right upper chin and right lower  extremity  Skin: Skin is warm and dry. No rash noted.  Psychiatric: She has a normal mood and affect.    ED Course  Procedures (including critical care time)  Rate: 76  Rhythm: normal sinus rhythm  QRS Axis: Left  Intervals: normal  ST/T Wave abnormalities: normal  Conduction Disutrbances: Incomplete right bundle branch block and left anterior fascicular block   Old EKG Reviewed: Criteria for Incomplete right bundle branch block and left anterior fascicular block a new compared to last tracing  Labs Reviewed  APTT - Abnormal; Notable for the following:    aPTT 23 (*)    All other components within normal limits  COMPREHENSIVE METABOLIC PANEL - Abnormal; Notable for the following:    Potassium 3.4 (*)    Glucose, Bld 142 (*)    Alkaline Phosphatase 120 (*)    Total Bilirubin 0.2 (*)    All other components within normal limits  POCT I-STAT, CHEM 8 - Abnormal; Notable for the following:    Glucose, Bld 146 (*)    All other components within normal limits  GLUCOSE, CAPILLARY - Abnormal; Notable for the following:    Glucose-Capillary 132 (*)    All other components within normal limits  PROTIME-INR  CBC  DIFFERENTIAL  CK TOTAL AND CKMB  TROPONIN I   Ct Head Wo Contrast  02/28/2012  *RADIOLOGY REPORT*  Clinical Data: Left-sided weakness and slurred speech.  CT HEAD WITHOUT CONTRAST  Technique:  Contiguous axial images were obtained from the base of the skull through the vertex without contrast.  Comparison: 07/03/2011  Findings: There is no acute intracranial hemorrhage, infarction, or mass lesion.  Old stroke in the right thalamus as well as an extensive old stroke in the left middle cerebral artery distribution with secondary encephalomalacia.  No acute abnormalities.  No osseous abnormalities of significance.  IMPRESSION: Old strokes.  No acute abnormalities.  Original Report Authenticated By: Gwynn Burly, M.D.     MDM  Patient presents with stroke symptoms.   Unfortunately the onset of her symptoms with her while outside any criteria for acute intervention. Dr. Thad Ranger from the neurology service has evaluated the patient. Please see the stroke team exam for full neurologic exam.  Pt will be admitted to the medical service with neurology consultation.  MRI has been ordered.         Celene Kras, MD 02/28/12 [phone]

## 2012-02-28 NOTE — Progress Notes (Signed)
02/28/12 1504  OTHER  CSW Follow Up Status No follow-up needed     Please consult unit based LCSW if psychosocial or placements needs are identified.  Dionne Milo MSW [facility]. Weekend/Social Worker [phone]

## 2012-02-28 NOTE — ED Notes (Signed)
Orthostatics will be completed upon return of patient from MRI

## 2012-02-28 NOTE — Consult Note (Signed)
Referring Physician: Lynelle Doctor    Chief Complaint: Fall  HPI: Andrea Cortez is an 56 y.o. female who went to bed last night at baseline having last spoken to her daughter at 56.  This morning awakened and when she attempted to get out of bed she fell.  Her husband attempted to help her up but the patient told him to leave her alone so she remained on the floor for a prolonged period of time.  Eventually the daughter was called.  When she went to see the patient she felt there was a new facial droop and that her speech was worse than baseline.  EMS was called at that time and patient was brought in as a code stroke. Patient has had three strokes in the past.  Two of them affected the right side and one affected the left.  At baseline she had full sensation on the right and speech was a little dysarthric.  She has been on ASA and Plavix in the past.  Currently takes 150mg  of Plavix and a 81mg  aspirin.  She was given the option of Aggrenox in the past but it was too expensive.    LSN: [id number] tPA Given: No: Outside time window  Past Medical History  Diagnosis Date  . Stroke 2004 x 2, 2010    left slightly hemorrhagic left temporal and parietal infarct, new acute right medial thalamus, and left anterior pons --> had extended rehabilitation course // Continues to have residual right UE weakness    -  Seizures  No past surgical history on file.  No family history on file. Social History:  does not have a smoking history on file. She does not have any smokeless tobacco history on file. Her alcohol and drug histories not on file.  Allergies:  Allergies  Allergen Reactions  . Iron Swelling  . Ivp Dye (Iodinated Diagnostic Agents) Other (See Comments)    unknown  . Niacin And Related Other (See Comments)    Must take flush-free Niacin    Medications: I have reviewed the patient's current medications. Prior to Admission:  ASA, Plavix, B12, Flexeril, Fenofibrate, Lasix, Niacin, Omega-3, Crestor,  Demadex, Effexor, Zonegran  ROS: History obtained from the patient  General ROS: negative for - chills, fatigue, fever, night sweats, weight gain or weight loss Psychological ROS: negative for - behavioral disorder, hallucinations, memory difficulties, mood swings or suicidal ideation Ophthalmic ROS: negative for - blurry vision, double vision, eye pain or loss of vision ENT ROS: negative for - epistaxis, nasal discharge, oral lesions, sore throat, tinnitus or vertigo Allergy and Immunology ROS: negative for - hives or itchy/watery eyes Hematological and Lymphatic ROS: negative for - bleeding problems, bruising or swollen lymph nodes Endocrine ROS: negative for - galactorrhea, hair pattern changes, polydipsia/polyuria or temperature intolerance Respiratory ROS: negative for - cough, hemoptysis, shortness of breath or wheezing Cardiovascular ROS: recent edema Gastrointestinal ROS: negative for - abdominal pain, diarrhea, hematemesis, nausea/vomiting or stool incontinence Genito-Urinary ROS: negative for - dysuria, hematuria, incontinence or urinary frequency/urgency Musculoskeletal ROS: negative for - joint swelling or muscular weakness Neurological ROS: as noted in HPI, headaches Dermatological ROS: negative for rash and skin lesion changes  Physical Examination: Blood pressure 142/128, pulse 75, resp. rate 17, SpO2 95.00%.  Neurologic Examination: Mental Status: Alert, oriented, thought content appropriate.  Patient does have some word-finding difficulties.  Dysarthric.  Able to follow 3 step commands without difficulty. Cranial Nerves: II: visual fields grossly normal, pupils equal, round, reactive to light and accommodation  III,IV, VI: ptosis not present, extra-ocular motions intact bilaterally V,VII: right facial droop, facial light touch sensation decreased on the right VIII: hearing normal bilaterally IX,X: gag reflex present XI: trapezius strength/neck flexion strength normal  bilaterally XII: tongue strength normal  Motor: Right : Upper extremity   3/5    Left:     Upper extremity   5/5  Lower extremity   4/5     Lower extremity   5/5 Tone and bulk: increased tone on the right Sensory: Pinprick and light touch decreased on the right Deep Tendon Reflexes: 2+ and symmetric with absent ankle jerks.   throughout Plantars: Right: upgoing   Left: downgoing Cerebellar: normal finger-to-nose and normal heel-to-shin test on the left; unable to perform on the right    Results for orders placed during the hospital encounter of 02/28/12 (from the past 48 hour(s))  PROTIME-INR     Status: Normal   Collection Time   02/28/12 11:58 AM      Component Value Range Comment   Prothrombin Time 12.8  11.6 - 15.2 (seconds)    INR 0.94  0.00 - 1.49    APTT     Status: Abnormal   Collection Time   02/28/12 11:58 AM      Component Value Range Comment   aPTT 23 (*) 24 - 37 (seconds)   CBC     Status: Normal   Collection Time   02/28/12 11:58 AM      Component Value Range Comment   WBC 5.4  4.0 - 10.5 (K/uL)    RBC 4.78  3.87 - 5.11 (MIL/uL)    Hemoglobin 12.9  12.0 - 15.0 (g/dL)    HCT 16.1  09.6 - 04.5 (%)    MCV 86.0  78.0 - 100.0 (fL)    MCH 27.0  26.0 - 34.0 (pg)    MCHC 31.4  30.0 - 36.0 (g/dL)    RDW 40.9  81.1 - 91.4 (%)    Platelets 312  150 - 400 (K/uL)   DIFFERENTIAL     Status: Normal   Collection Time   02/28/12 11:58 AM      Component Value Range Comment   Neutrophils Relative 49  43 - 77 (%)    Neutro Abs 2.6  1.7 - 7.7 (K/uL)    Lymphocytes Relative 39  12 - 46 (%)    Lymphs Abs 2.1  0.7 - 4.0 (K/uL)    Monocytes Relative 8  3 - 12 (%)    Monocytes Absolute 0.4  0.1 - 1.0 (K/uL)    Eosinophils Relative 3  0 - 5 (%)    Eosinophils Absolute 0.2  0.0 - 0.7 (K/uL)    Basophils Relative 1  0 - 1 (%)    Basophils Absolute 0.1  0.0 - 0.1 (K/uL)   COMPREHENSIVE METABOLIC PANEL     Status: Abnormal   Collection Time   02/28/12 11:58 AM      Component Value  Range Comment   Sodium 136  135 - 145 (mEq/L)    Potassium 3.4 (*) 3.5 - 5.1 (mEq/L)    Chloride 98  96 - 112 (mEq/L)    CO2 24  19 - 32 (mEq/L)    Glucose, Bld 142 (*) 70 - 99 (mg/dL)    BUN 14  6 - 23 (mg/dL)    Creatinine, Ser 7.82  0.50 - 1.10 (mg/dL)    Calcium 9.3  8.4 - 10.5 (mg/dL)    Total Protein 7.5  6.0 - 8.3 (g/dL)    Albumin 3.9  3.5 - 5.2 (g/dL)    AST 29  0 - 37 (U/L)    ALT 23  0 - 35 (U/L)    Alkaline Phosphatase 120 (*) 39 - 117 (U/L)    Total Bilirubin 0.2 (*) 0.3 - 1.2 (mg/dL)    GFR calc non Af Amer >90  >90 (mL/min)    GFR calc Af Amer >90  >90 (mL/min)   CK TOTAL AND CKMB     Status: Normal   Collection Time   02/28/12 11:58 AM      Component Value Range Comment   Total CK 120  7 - 177 (U/L)    CK, MB 1.7  0.3 - 4.0 (ng/mL)    Relative Index 1.4  0.0 - 2.5    TROPONIN I     Status: Normal   Collection Time   02/28/12 11:58 AM      Component Value Range Comment   Troponin I <0.30  <0.30 (ng/mL)   POCT I-STAT, CHEM 8     Status: Abnormal   Collection Time   02/28/12 12:07 PM      Component Value Range Comment   Sodium 139  135 - 145 (mEq/L)    Potassium 4.0  3.5 - 5.1 (mEq/L)    Chloride 100  96 - 112 (mEq/L)    BUN 14  6 - 23 (mg/dL)    Creatinine, Ser 1.61  0.50 - 1.10 (mg/dL)    Glucose, Bld 096 (*) 70 - 99 (mg/dL)    Calcium, Ion 0.45  1.12 - 1.32 (mmol/L)    TCO2 26  0 - 100 (mmol/L)    Hemoglobin 15.0  12.0 - 15.0 (g/dL)    HCT 40.9  81.1 - 91.4 (%)   GLUCOSE, CAPILLARY     Status: Abnormal   Collection Time   02/28/12 12:26 PM      Component Value Range Comment   Glucose-Capillary 132 (*) 70 - 99 (mg/dL)    Ct Head Wo Contrast  02/28/2012  *RADIOLOGY REPORT*  Clinical Data: Left-sided weakness and slurred speech.  CT HEAD WITHOUT CONTRAST  Technique:  Contiguous axial images were obtained from the base of the skull through the vertex without contrast.  Comparison: 07/03/2011  Findings: There is no acute intracranial hemorrhage, infarction, or  mass lesion.  Old stroke in the right thalamus as well as an extensive old stroke in the left middle cerebral artery distribution with secondary encephalomalacia.  No acute abnormalities.  No osseous abnormalities of significance.  IMPRESSION: Old strokes.  No acute abnormalities.  Original Report Authenticated By: Gwynn Burly, M.D.    Assessment: 56 y.o. female with a history of strokes in the past, now presenting with symptoms suggestive of a new left brain event.  CT is unremarkable for any acute event.  On novel antiplatelet regimen.  Further work up indicated.  Stroke Risk Factors - hyperlipidemia and strokes in the past  Plan: 1. HgbA1c, fasting lipid panel 2. MRI, MRA  of the brain without contrast 3. PT consult, OT consult, Speech consult 4. Echocardiogram 5. Carotid dopplers 6. Prophylactic therapy-Antiplatelet med: Plavix - dose 75mg  and ASA 81mg  to be continued for now.  Will look into the possibility of the patient getting some assistance for Aggrenox 7. Risk factor modification 8. Telemetry monitoring 9. Frequent neuro checks   Thana Farr, MD [facility] [phone] 02/28/2012, 2:21 PM

## 2012-02-28 NOTE — ED Notes (Signed)
Patient refused IV. 

## 2012-02-28 NOTE — ED Notes (Signed)
Per EMS Patient woke up this morning and fell onto her face. Husband noticed her speech was more slurred than normal. Patient has slurred speech and trouble walking from previous strokes, just complaints are worse than normal

## 2012-02-28 NOTE — ED Notes (Signed)
MD at bedside. Neurologist/ admitting

## 2012-02-29 DIAGNOSIS — G459 Transient cerebral ischemic attack, unspecified: Secondary | ICD-10-CM | POA: Diagnosis present

## 2012-02-29 DIAGNOSIS — R209 Unspecified disturbances of skin sensation: Secondary | ICD-10-CM

## 2012-02-29 DIAGNOSIS — E1165 Type 2 diabetes mellitus with hyperglycemia: Secondary | ICD-10-CM | POA: Diagnosis present

## 2012-02-29 DIAGNOSIS — I6340 Cerebral infarction due to embolism of unspecified cerebral artery: Secondary | ICD-10-CM

## 2012-02-29 DIAGNOSIS — R5381 Other malaise: Secondary | ICD-10-CM

## 2012-02-29 DIAGNOSIS — R4789 Other speech disturbances: Secondary | ICD-10-CM

## 2012-02-29 LAB — LIPID PANEL
Cholesterol: 168 mg/dL (ref 0–200)
Cholesterol: 170 mg/dL (ref 0–200)
HDL: 35 mg/dL — ABNORMAL LOW (ref >39)
Total CHOL/HDL Ratio: 4.7 RATIO
Triglycerides: 412 mg/dL — ABNORMAL HIGH (ref <150)
VLDL: UNDETERMINED mg/dL (ref 0–40)

## 2012-02-29 LAB — URINALYSIS, ROUTINE W REFLEX MICROSCOPIC
Bilirubin Urine: NEGATIVE
Glucose, UA: NEGATIVE mg/dL
Hgb urine dipstick: NEGATIVE
Protein, ur: NEGATIVE mg/dL

## 2012-02-29 LAB — BASIC METABOLIC PANEL
Chloride: 97 mEq/L (ref 96–112)
GFR calc Af Amer: >90 mL/min (ref >90)
GFR calc non Af Amer: 80 mL/min — ABNORMAL LOW (ref >90)
Potassium: 3.4 mEq/L — ABNORMAL LOW (ref 3.5–5.1)
Sodium: 135 mEq/L (ref 135–145)

## 2012-02-29 LAB — TSH: TSH: 2.976 u[IU]/mL (ref 0.350–4.500)

## 2012-02-29 LAB — URINE MICROSCOPIC-ADD ON

## 2012-02-29 LAB — HEMOGLOBIN A1C
Hgb A1c MFr Bld: 7.2 % — ABNORMAL HIGH (ref <5.7)
Mean Plasma Glucose: 160 mg/dL — ABNORMAL HIGH (ref <117)

## 2012-02-29 LAB — DIFFERENTIAL
Basophils Absolute: 0 10*3/uL (ref 0.0–0.1)
Basophils Relative: 1 % (ref 0–1)
Eosinophils Absolute: 0.2 10*3/uL (ref 0.0–0.7)
Monocytes Relative: 11 % (ref 3–12)
Neutrophils Relative %: 48 % (ref 43–77)

## 2012-02-29 LAB — CARDIAC PANEL(CRET KIN+CKTOT+MB+TROPI)
CK, MB: 1.7 ng/mL (ref 0.3–4.0)
Relative Index: 1.4 (ref 0.0–2.5)
Relative Index: 1.4 (ref 0.0–2.5)
Total CK: 114 U/L (ref 7–177)
Total CK: 118 U/L (ref 7–177)
Troponin I: <0.3 ng/mL (ref <0.30)

## 2012-02-29 LAB — CBC
Hemoglobin: 13.5 g/dL (ref 12.0–15.0)
MCH: 28 pg (ref 26.0–34.0)
MCHC: 32.6 g/dL (ref 30.0–36.0)
Platelets: 320 10*3/uL (ref 150–400)
RDW: 13.6 % (ref 11.5–15.5)

## 2012-02-29 LAB — GLUCOSE, CAPILLARY: Glucose-Capillary: 122 mg/dL — ABNORMAL HIGH (ref 70–99)

## 2012-02-29 MED ORDER — PANTOPRAZOLE SODIUM 40 MG PO TBEC
40.0000 mg | DELAYED_RELEASE_TABLET | Freq: Every day | ORAL | Status: DC
  Administered 2012-02-29 – 2012-03-01 (×2): 40 mg via ORAL
  Filled 2012-02-29: qty 1

## 2012-02-29 MED ORDER — DIPHENHYDRAMINE HCL 25 MG PO CAPS
25.0000 mg | ORAL_CAPSULE | Freq: Once | ORAL | Status: AC
  Administered 2012-02-29: 25 mg via ORAL
  Filled 2012-02-29: qty 1

## 2012-02-29 MED ORDER — ATORVASTATIN CALCIUM 80 MG PO TABS
80.0000 mg | ORAL_TABLET | Freq: Every day | ORAL | Status: DC
  Administered 2012-02-29: 80 mg via ORAL
  Filled 2012-02-29 (×2): qty 1

## 2012-02-29 MED ORDER — INSULIN ASPART 100 UNIT/ML ~~LOC~~ SOLN: 0.0000 [IU] | Freq: Three times a day (TID) | SUBCUTANEOUS | Status: DC

## 2012-02-29 MED ORDER — METFORMIN HCL 500 MG PO TABS
500.0000 mg | ORAL_TABLET | Freq: Every day | ORAL | Status: DC
  Administered 2012-03-01: 500 mg via ORAL
  Filled 2012-02-29 (×2): qty 1

## 2012-02-29 NOTE — Progress Notes (Signed)
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 Admitted due to onset of new slurred speech and R sided weakness. No new CVA. ? TIA. Pt will benefit from skilled OT to assist with proper positioning of R hand to prevent further contracture development. Will contact Carolyne Fiscal from [facility]. Pt/family is in agreement for need for splint.     OT Assessment  Patient needs continued OT Services    Follow Up Recommendations  Home health OT for positioning/orthotics   Barriers to Discharge None    Equipment Recommendations  None recommended by OT    Recommendations for Other Services Other (comment) (orthotic consult)  Frequency  Min 2X/week    Precautions / Restrictions Precautions Precautions: Fall Restrictions Weight Bearing Restrictions: No   Pertinent Vitals/Pain No c/o pain    ADL  Eating/Feeding: Simulated;Modified independent Where Assessed - Eating/Feeding: Chair Grooming: Performed;Modified independent Where Assessed - Grooming: Unsupported standing Upper Body Bathing: Simulated;Minimal assistance Where Assessed - Upper Body Bathing: Unsupported sitting Lower Body Bathing: Simulated;Minimal assistance Where Assessed - Lower Body Bathing: Unsupported sit to stand Upper Body Dressing: Performed;Minimal assistance Where Assessed - Upper Body Dressing: Unsupported sitting Lower Body Dressing: Simulated;Minimal assistance Where Assessed - Lower Body Dressing: Unsupported sit to stand Toilet Transfer: Performed;Supervision/safety Toilet Transfer Method: Sit to Barista: Comfort height toilet Toileting - Clothing Manipulation and Hygiene: Performed;Modified independent Where Assessed - Toileting Clothing  Manipulation and Hygiene: Standing Transfers/Ambulation Related to ADLs: min guard with QC ADL Comments: Pt feels that she is at baseline with ADL    OT Diagnosis: Generalized weakness;Hemiplegia dominant side  OT Problem List:  contracture OT Treatment Interventions: Splinting;Therapeutic activities;Patient/family education   OT Goals Acute Rehab OT Goals OT Goal Formulation: With patient Time For Goal Achievement: 03/07/12 Potential to Achieve Goals: Good Miscellaneous OT Goals Miscellaneous OT Goal #1: Pt will be fitted with R modified resting hand splint to prevent further contracture development. OT Goal: Miscellaneous Goal #1 - Progress: Goal set today Miscellaneous OT Goal #2: pt'family will be independent with splint wearing protocal. OT Goal: Miscellaneous Goal #2 - Progress: Goal set today  Visit Information  Last OT Received On: 02/29/12 Assistance Needed: +1 PT/OT Co-Evaluation/Treatment: Yes    Subjective Data   I need a new splint.   Prior Functioning  Home Living Lives With: Spouse;Family Available Help at Discharge: Family Type of Home: House Home Access: Ramped entrance Home Layout: One level Bathroom Shower/Tub: Walk-in Insurance account manager: Standard Bathroom Accessibility: Yes How Accessible: Accessible via wheelchair;Accessible via walker Home Adaptive Equipment: Shower chair with back;Bedside commode/3-in-1;Grab bars in shower;Quad cane (quad cane is bent; lift chair) Prior Function Level of Independence: Independent with assistive device(s) Able to Take Stairs?: Yes Driving: No Vocation: On disability Communication Communication: Expressive difficulties Dominant Hand: Left    Cognition  Overall Cognitive Status: History of cognitive impairments - at baseline Arousal/Alertness: Awake/alert Orientation Level: Appears intact for tasks assessed Behavior During Session: [facility] for tasks performed    Extremity/Trunk Assessment  Right Upper Extremity Assessment RUE ROM/Strength/Tone: Deficits RUE ROM/Strength/Tone Deficits: spastic RUE. gross assist (R tissue tigthening. Flexor contracture. ) RUE Sensation: Deficits RUE Coordination: Deficits Left Upper Extremity Assessment LUE ROM/Strength/Tone: Within functional levels LUE Sensation: WFL - Light Touch;WFL - Proprioception LUE Coordination: WFL - gross/fine motor  Right Lower Extremity Assessment RLE ROM/Strength/Tone: Deficits RLE ROM/Strength/Tone Deficits: Hip Flexion, Knee extension/flexion: 4/5; Ankle DF: 3+/5, PF: 4/5 RLE Sensation: WFL - Light Touch Left Lower Extremity Assessment LLE ROM/Strength/Tone: Within functional levels LLE Sensation: WFL - Light Touch   Mobility Bed Mobility Bed Mobility: Supine to Sit Supine to Sit: 5: Supervision Transfers Transfers: Sit to Stand;Stand to Sit Sit to Stand: 5: Supervision Stand to Sit: 5: Supervision   Exercise    Balance  S  End of Session OT - End of Session Equipment Utilized During Treatment: Gait belt Activity Tolerance: Patient tolerated treatment well Patient left: in chair;with call bell/phone within reach Nurse Communication: Mobility status   Nicki Gracy,HILLARY 02/29/2012, 4:57 PM [facility], OTR/L  [phone] 02/29/2012

## 2012-02-29 NOTE — Progress Notes (Signed)
Speech Language Pathology Dysphagia Treatment Patient Details Name: Andrea Cortez MRN: [id number] DOB: 21-Oct-1955 Today's Date: 02/29/2012 Time: 1500-1530 SLP Time Calculation (min): 30 min  Assessment / Plan / Recommendation Clinical Impression  Purpose of tx for diet tolerance and to provide skilled education to patient and caregivers on strategies to decrease risk of apsiration. Patient required min to moderate verbal cues to restate recommended strategies and aspiration precautions . Spouse present during treatment and demonstrated understanding of recommendations. D/C from ST treatment as all goals are met. Patient's swallow judged to be baseline. Patient may benefit from GI consult due to s/s present during initial BSE.      Diet Recommendation  Continue with Current Diet: Regular;Thin liquid    SLP Plan Discharge SLP treatment due to (comment);Other (Comment) (goals met)      Swallowing Goals  SLP Swallowing Goals Swallow Study Goal #1 - Progress: Met Swallow Study Goal #2 - Progress: Met  General Temperature Spikes Noted: No Respiratory Status: Room air Behavior/Cognition: Alert;Cooperative Oral Cavity - Dentition: Dentures, top;Dentures, bottom Patient Positioning: Upright in chair  Oral Cavity - Oral Hygiene Brush patient's teeth BID with toothbrush (using toothpaste with fluoride): Yes   Dysphagia Treatment Treatment focused on: Skilled observation of diet tolerance;Patient/family/caregiver education Family/Caregiver Educated: spouse Treatment Methods/Modalities: Other (comment) (provide education concerning strategies) Patient observed directly with PO's: No Type of cueing: Verbal Amount of cueing: Minimal  Andrea Fowler MS, CCC-SLP [phone] [facility] 02/29/2012, 5:58 PM

## 2012-02-29 NOTE — Progress Notes (Signed)
Medical Student Daily Progress Note  Subjective: Patient is doing a little better than yesterday. Her speech is much improved and she isn't having to search for the right words with as much difficulty. Her right arm is not quite back to baseline but she does note improvement in her arm and B/L LE. She does report having 1 episode of her usual diarrhea last night. She notes that her diarrhea revolves around her meal schedule developing soon after eating each meal, no matter the content according to her. She denies any fevers, chills, nausea, vomiting, night sweats. She is sitting up in bed comfortably upon exam.   Objective: Vital signs in last 24 hours: Filed Vitals:   02/29/12 0000 02/29/12 0200 02/29/12 0400 02/29/12 0600  BP: 151/79 155/91 147/78 140/82  Pulse: 72 82 75 75  Temp: 97.8 F (36.6 C) 97.1 F (36.2 C) 98.5 F (36.9 C) 98.2 F (36.8 C)  TempSrc: Oral Oral Oral Oral  Resp: 18 18 19 19   Height:      Weight:      SpO2: 95% 96% 95% 94%   Weight change:  No intake or output data in the 24 hours ending 02/29/12 1105 Physical Exam: BP 140/82  Pulse 75  Temp(Src) 98.2 F (36.8 C) (Oral)  Resp 19  Ht 5\' 6"  (1.676 m)  Wt 104.463 kg (230 lb 4.8 oz)  BMI 37.17 kg/m2  SpO2 94% General appearance: alert, cooperative and no distress Head: Normocephalic, without obvious abnormality, atraumatic Throat: lips, mucosa, and tongue normal; teeth and gums normal Neck: no adenopathy, no carotid bruit, no JVD, supple, symmetrical, trachea midline and thyroid not enlarged, symmetric, no tenderness/mass/nodules Back: symmetric, no curvature. ROM normal. No CVA tenderness. Lungs: clear to auscultation bilaterally Heart: regular rate and rhythm, S1, S2 normal, no murmur, click, rub or gallop Abdomen: soft, non-tender; bowel sounds normal; no masses,  no organomegaly Extremities: extremities normal, atraumatic, no cyanosis or edema Skin: Skin color, texture, turgor normal. No rashes or  lesions Neurologic: Mental status: Alert, oriented, thought content appropriate Cranial nerves: normal Sensory: reduced R arm, R leg (near baseline) Motor: near baseline R arm and R leg weakness Reflexes: 2+ and symmetric Lab Results: Basic Metabolic Panel:  Lab 02/29/12 1610 02/28/12 1207 02/28/12 1158  NA 135 139 --  K 3.4* 4.0 --  CL 97 100 --  CO2 27 -- 24  GLUCOSE 133* 146* --  BUN 13 14 --  CREATININE 0.80 0.70 --  CALCIUM 9.3 -- 9.3  MG -- -- --  PHOS -- -- --   Liver Function Tests:  Lab 02/28/12 1158  AST 29  ALT 23  ALKPHOS 120*  BILITOT 0.2*  PROT 7.5  ALBUMIN 3.9   CBC:  Lab 02/28/12 1207 02/28/12 1158  WBC -- 5.4  NEUTROABS -- 2.6  HGB 15.0 12.9  HCT 44.0 41.1  MCV -- 86.0  PLT -- 312   Cardiac Enzymes:  Lab 02/29/12 0840 02/29/12 0211 02/28/12 2033  CKTOTAL 118 114 111  CKMB 1.7 1.6 1.7  CKMBINDEX -- -- --  TROPONINI <0.30 <0.30 <0.30   CBG:  Lab 02/28/12 1226  GLUCAP 132*   Fasting Lipid Panel:  Lab 02/29/12 0214  CHOL 168170  HDL 36*35*  LDLCALC UNABLE TO CALCULATE IF TRIGLYCERIDE OVER 400 mg/dLUNABLE TO CALCULATE IF TRIGLYCERIDE OVER 400 mg/dL  TRIG 960*454*  CHOLHDL 4.74.9  LDLDIRECT --   Coagulation:  Lab 02/28/12 1158  LABPROT 12.8  INR 0.94    Studies/Results: Dg Chest  2 View  02/28/2012  *RADIOLOGY REPORT*  Clinical Data: Acute stroke.  CHEST - 2 VIEW  Comparison:  07/03/2011  Findings:  The heart size and mediastinal contours are within normal limits.  Both lungs are clear.  The visualized skeletal structures are unremarkable.  IMPRESSION: No active cardiopulmonary disease.  Original Report Authenticated By: Danae Orleans, M.D.   Ct Head Wo Contrast  02/28/2012  *RADIOLOGY REPORT*  Clinical Data: Left-sided weakness and slurred speech.  CT HEAD WITHOUT CONTRAST  Technique:  Contiguous axial images were obtained from the base of the skull through the vertex without contrast.  Comparison: 07/03/2011  Findings: There  is no acute intracranial hemorrhage, infarction, or mass lesion.  Old stroke in the right thalamus as well as an extensive old stroke in the left middle cerebral artery distribution with secondary encephalomalacia.  No acute abnormalities.  No osseous abnormalities of significance.  IMPRESSION: Old strokes.  No acute abnormalities.  Original Report Authenticated By: Gwynn Burly, M.D.   Mr Angiogram Head Wo Contrast  02/28/2012  *RADIOLOGY REPORT*  Clinical Data:  A aphasia.  Weakness.  MRI HEAD WITHOUT CONTRAST MRA HEAD WITHOUT CONTRAST  Technique:  Multiplanar, multiecho pulse sequences of the brain and surrounding structures were obtained without intravenous contrast. Angiographic images of the head were obtained using MRA technique without contrast.  Comparison:  Head CT same day.  Previous MRI 08/13/2009.  MRI HEAD  Findings:  Diffusion imaging does not show any acute or subacute infarction.  The brainstem is normal except for Wallerian degeneration on the left.  No focal cerebellar insult.  The cerebral hemispheres show an old lacunar infarction in the right thalamus, chronic small vessel changes within the deep white matter and an old left middle cerebral artery territory stroke that has progressed to atrophy encephalomalacia with adjacent gliosis.  No mass lesion, hemorrhage, hydrocephalus or extra-axial collection. There is flow in the major vessels at the base of the brain.  No pituitary mass.  There is mild mucosal inflammation of the right maxillary sinus.  IMPRESSION: No acute infarction.  Old left MCA territory stroke.  Chronic white matter small vessel disease.  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  MRA HEAD  Findings: Both internal carotid arteries are widely patent into the brain.  The anterior and middle cerebral vessels are patent without proximal stenosis, aneurysm or vascular malformation. There is a missing MCA branch on the left consistent with the old infarction in that region.  Both vertebral  arteries are patent to the basilar. No basilar stenosis.  Posterior circulation branch vessels appear normal.  IMPRESSION: Normal intracranial MR angiography of the large and medium-sized vessels with exception that there is a missing the left MCA branch consistent with the old infarction in that distribution.  Original Report Authenticated By: Thomasenia Sales, M.D.   Mr Brain Wo Contrast  02/28/2012  *RADIOLOGY REPORT*  Clinical Data:  A aphasia.  Weakness.  MRI HEAD WITHOUT CONTRAST MRA HEAD WITHOUT CONTRAST  Technique:  Multiplanar, multiecho pulse sequences of the brain and surrounding structures were obtained without intravenous contrast. Angiographic images of the head were obtained using MRA technique without contrast.  Comparison:  Head CT same day.  Previous MRI 08/13/2009.  MRI HEAD  Findings:  Diffusion imaging does not show any acute or subacute infarction.  The brainstem is normal except for Wallerian degeneration on the left.  No focal cerebellar insult.  The cerebral hemispheres show an old lacunar infarction in the right thalamus, chronic small vessel  changes within the deep white matter and an old left middle cerebral artery territory stroke that has progressed to atrophy encephalomalacia with adjacent gliosis.  No mass lesion, hemorrhage, hydrocephalus or extra-axial collection. There is flow in the major vessels at the base of the brain.  No pituitary mass.  There is mild mucosal inflammation of the right maxillary sinus.  IMPRESSION: No acute infarction.  Old left MCA territory stroke.  Chronic white matter small vessel disease.  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  MRA HEAD  Findings: Both internal carotid arteries are widely patent into the brain.  The anterior and middle cerebral vessels are patent without proximal stenosis, aneurysm or vascular malformation. There is a missing MCA branch on the left consistent with the old infarction in that region.  Both vertebral arteries are patent to the  basilar. No basilar stenosis.  Posterior circulation branch vessels appear normal.  IMPRESSION: Normal intracranial MR angiography of the large and medium-sized vessels with exception that there is a missing the left MCA branch consistent with the old infarction in that distribution.  Original Report Authenticated By: Thomasenia Sales, M.D.   Medications:  Medication Dose Route Frequency  . aspirin EC tablet 81 mg  81 mg Oral Daily  . atorvastatin (LIPITOR) tablet 40 mg  40 mg Oral q1800  . clopidogrel (PLAVIX) tablet 75 mg  75 mg Oral Q breakfast  . diphenhydrAMINE (BENADRYL) capsule 25 mg  25 mg Oral Once  . enoxaparin (LOVENOX) injection 40 mg  40 mg Subcutaneous Q24H  . senna-docusate (Senokot-S) tablet 1 tablet  1 tablet Oral QHS PRN  . venlafaxine [facility]) tablet 75 mg  75 mg Oral BID  . zonisamide (ZONEGRAN) capsule 200 mg  200 mg Oral QHS    Scheduled Meds:   . aspirin EC  81 mg Oral Daily  . atorvastatin  40 mg Oral q1800  . clopidogrel  75 mg Oral Q breakfast  . diphenhydrAMINE  25 mg Oral Once  . enoxaparin  40 mg Subcutaneous Q24H  . venlafaxine  75 mg Oral BID  . zonisamide  200 mg Oral QHS   Continuous Infusions:  PRN Meds:.senna-docusate   Assessment/Plan: Acute neuro deficits - Patient has PMH of 3 strokes, med noncompliance 2/2 memory, and possible left carotid stenosis. Evidence highly suggestive of TIA given that her symptoms began resolving slowly after acute onset and her risk factors (age, HL, h/o stroke, carotid stenosis). - Appreciate neurology consult in the management of this patient  - carotid dopplers - 2 D echo - risk factor modification - continue plavix and aspirin - fall precautions  Hyperlipidemia - Patient has a history of poor lipids for which she was supposed to be taking fenofibrate, crestor, and niacin at home with unknown compliance. CHL = 168, TG = 412, HDL = 36 - direct LDL pending - carotid doppler pending - continue lipitor 40 mg daily     Diarrhea - Patient reports having diarrhea after she eats any meal. She also notes that her diarrheal episodes began about 6 months ago when she began taking Plavix BID instead of once daily. She notes that they sink and do not float. - TTG - ferritin level - TSH - Vit. D level  Seizures - Patient developed seizures after her first stroke in 2004. She has since not had any seizures as she has also been taking zonisamide daily since then. She has no other history of sz d/o nor any family history. - continue zonisamide 200 mg daily  -  seizure precations  Depression - Developed after her first stroke in 2004. Has been stable and well managed on her home regimen.  - continue venlafaxine 75 mg BID   Vitamin B12 deficiency - HgB = 12.9, MCV = 86. She has a history of B12 deficiency, etiology u/k. She takes daily tablets and monthly injection of B12.  - hold vitamin B12 for now   DVT ppx - Lovenox  Disposition - Dopplers and echo are pending for her. Will attempt to simplify med list if possible and    LOS: 1 day   This is a Psychologist, occupational Note.  The care of the patient was discussed with Dr. Priscella Mann and the assessment and plan formulated with their assistance.  Please see their attached note for official documentation of the daily encounter.  Andrea Cortez 02/29/2012, 11:05 AM   Resident Addendum to Medical Student Note   I have seen and examined the patient, and reviewed the daily progress note by Andrea Chamber, MS III and discussed the care of the patient with them.  See below for documentation of my findings, assessment, and plans.  HPI: There were no events overnight. Currently, the patient confirms symptoms of slowed ambulation, but improving speech (back to baseline), and right UE weakness back to baseline. She denies symptoms of new focal neurologic deficits such as worsening speech, new facial droop, unilateral diminished sensations or mobility (beyond  baseline). She has not yet attempted to ambulate significantly. Denies fevers, chills, nausea, vomiting.  Daughter does express concerns about patient's lability of mood at home, gets angry easily. Her PCP increased her Effexor to 2 pills a day instead of once daily (but 2 pills makes the patient too sedated, therefore, does not use often). The Effexor was apparently previously started for depression > 10 years ago when there was an unfortunate family circumstance. Currently, denies SI/HI. Expresses frustration with ailing health and acknowledges quick feelings of anger, that she cannot completely explain.   OBJECTIVE: VS: Reviewed as documented in electronic medical record  Meds: Reviewed as documented in electronic medical record  Labs: Reviewed as documented in electronic medical record  Imaging: Reviewed as documented in electronic medical record   Physical Exam: General: Vital signs reviewed and noted. Well-developed, well-nourished, in no acute distress; alert, appropriate and cooperative throughout examination. Speech more clear today.  Lungs:  Normal respiratory effort. Clear to auscultation BL without crackles or wheezes.  Heart: RRR. S1 and S2 normal without gallop, murmur, or rubs.  Abdomen:  BS normoactive. Soft, Nondistended, non-tender.  No masses or organomegaly.  Extremities: No pretibial edema.  Neurologic: Muscle strength 4/5 Rt UE and 5/5 left UE/ LE and right LE. Sensations diminished on right, but back to baseline.     ASSESSMENT/ PLAN: Pt is a 56 y.o. yo female with a PMHx of multiple prior strokes with residual right sided weakness, resultant seizure disorder that is well controlled at present, and depression who was admitted on 02/28/2012 with symptoms of transient worsening of baseline dysphagia and right sided weakness, which at this time time of unclear etiology, possibly secondary to acute illness causing worsening of old deficits versus TIA.  1) Acute neuro deficits  - concerning for possible TIA versus acute worsening of old deficits due to acute illness (though none have yet been identified). MRI negative for acute stroke. Unclear about compliance with medications, as daughter will frequently find pills around the house and pt admittedly has had issues with her memory. - Appreciate neurology/  stroke team assistance and input in the management of our patient.   - Discussed with Dr. Pearlean Brownie, and there is no great evidence to support BID plavix. As well, given that she is on plavix/ aspirin due to stroke history alone and not due to underlying cardiac history - the current regimen will likely provide more risk for bleed than evidence of benefit. - Will DC aspirin and continue plavix alone per recommendation of neurology team - Pending complete stroke workup - 2D echo, carotid dopplers. - Continue to encourage compliance. - PT/ OT / Speech consult - will then set up for [facility] services - notably, pt's daughter intends to provide 24 hour support at home (has quit her job to care for mother). - Appreciate speech, PT/ OT assistance and input in the management of our patient. - Will continue thin liquids per speech rec. - Will further explore subjective difficulty with swallow, will consider barium swallow study.  - Pending UA.  2) Hyperlipidemia - Patient has had an assorted home regimen including fenofibrate, crestor, and niacin. Per pharmacy records, seems to be getting the niacin and crestor only.. CHL = 168, TG = 412, HDL = 36 - notably, this was NOT fasting, therefore, not a great representation. - direct LDL pending - continue lipitor 40 mg daily --> escalate if indicated for LDL > 100.  3) Depression - Developed after her first stroke in 2004. - continue venlafaxine 75 mg BID currently. - Will review with patient past effective therapies, sort out if majority of symptoms are depression versus anxiety. - Will refer to outpatient services.  4) Disposition -  Dopplers and echo are pending for her. Will attempt to simplify med list if possible, and assess home health needs.  Length of Stay: 1  Signed: Johnette Abraham, Erline Levine, Internal Medicine Resident Pager: [phone] (7AM-5PM) 02/29/2012, 12:41 PM

## 2012-02-29 NOTE — Progress Notes (Signed)
Bilateral carotid artery duplex completed.  Preliminary report is no evidence of significant ICA stenosis. 

## 2012-02-29 NOTE — Evaluation (Signed)
Physical Therapy Evaluation Patient Details Name: Andrea Cortez MRN: [id number] DOB: 31-Mar-1956 Today's Date: 02/29/2012 Time: 4098-1191 PT Time Calculation (min): 21 min  PT Assessment / Plan / Recommendation Clinical Impression  Pt presents with a medical diagnosis of TIA with history of 3 strokes with residual right sided weakness. Pt states increased RLE weakness with slight balance deficits. Pt will benefit from skilled PT in the acute care setting in order to maximize functional mobility and safety    PT Assessment  Patient needs continued PT services    Follow Up Recommendations  Home health PT;Supervision/Assistance - 24 hour    Barriers to Discharge        lEquipment Recommendations  None recommended by OT;None recommended by PT    Recommendations for Other Services     Frequency Min 4X/week    Precautions / Restrictions Precautions Precautions: Fall Restrictions Weight Bearing Restrictions: No         Mobility  Bed Mobility Bed Mobility: Supine to Sit Supine to Sit: 5: Supervision Transfers Transfers: Sit to Stand;Stand to Sit Sit to Stand: 4: Min assist;With upper extremity assist;From bed Stand to Sit: 4: Min guard;With upper extremity assist;To chair/3-in-1 Details for Transfer Assistance: Increased time to complete stand with quad cane, cueing for hand placement. Min assist for stability. Pt able to control descend and reach back to chair without cueing Ambulation/Gait Ambulation/Gait Assistance: 4: Min assist Ambulation Distance (Feet): 200 Feet Assistive device: Large base quad cane Ambulation/Gait Assistance Details: VC for proper sequencing and use of quad cane. WIth timing, pt requiring min guard assist only once stable and with sequencing. Pt near baseline for ambulation although requiring assist due to slight balance loss with correction. Gait Pattern: Step-to pattern;Decreased stance time - right;Decreased step length - left;Decreased hip/knee  flexion - right;Decreased dorsiflexion - right;Trunk flexed Gait velocity: decreased gait speed Modified Rankin (Stroke Patients Only) Pre-Morbid Rankin Score: Moderate disability Modified Rankin: Moderately severe disability    Exercises     PT Diagnosis: Abnormality of gait  PT Problem List: Decreased strength;Decreased activity tolerance;Decreased balance;Decreased mobility;Decreased knowledge of use of DME;Decreased safety awareness;Decreased knowledge of precautions PT Treatment Interventions: DME instruction;Gait training;Stair training;Functional mobility training;Therapeutic activities;Therapeutic exercise;Balance training;Neuromuscular re-education;Patient/family education   PT Goals Acute Rehab PT Goals PT Goal Formulation: With patient Time For Goal Achievement: 03/07/12 Potential to Achieve Goals: Good Pt will go Supine/Side to Sit: with modified independence PT Goal: Supine/Side to Sit - Progress: Goal set today Pt will go Sit to Supine/Side: with modified independence PT Goal: Sit to Supine/Side - Progress: Goal set today Pt will go Sit to Stand: with modified independence PT Goal: Sit to Stand - Progress: Goal set today Pt will go Stand to Sit: with modified independence PT Goal: Stand to Sit - Progress: Goal set today Pt will Transfer Bed to Chair/Chair to Bed: with supervision PT Transfer Goal: Bed to Chair/Chair to Bed - Progress: Goal set today Pt will Ambulate: >150 feet;with supervision;with other equipment (comment) (quad cane) PT Goal: Ambulate - Progress: Goal set today Pt will Go Up / Down Stairs: 1-2 stairs;with rail(s);with min assist PT Goal: Up/Down Stairs - Progress: Goal set today  Visit Information  Last PT Received On: 02/29/12 Assistance Needed: +1 PT/OT Co-Evaluation/Treatment: Yes    Subjective Data      Prior Functioning  Home Living Lives With: Spouse;Family Available Help at Discharge: Family Type of Home: House Home Access: Ramped  entrance (has 2 stairs on back entrance) Home Layout: One level Bathroom  Shower/Tub: Walk-in Insurance account manager: Standard Bathroom Accessibility: Yes How Accessible: Accessible via wheelchair;Accessible via walker Home Adaptive Equipment: Shower chair with back;Bedside commode/3-in-1;Grab bars in shower;Quad cane (quad cane is bent; lift chair) Prior Function Level of Independence: Independent with assistive device(s) Able to Take Stairs?: Yes Driving: No Vocation: On disability Communication Communication: Expressive difficulties Dominant Hand: Left    Cognition  Overall Cognitive Status: History of cognitive impairments - at baseline Arousal/Alertness: Awake/alert Orientation Level: Appears intact for tasks assessed Behavior During Session: [facility] for tasks performed    Extremity/Trunk Assessment Right Upper Extremity Assessment RUE ROM/Strength/Tone: Deficits Right Lower Extremity Assessment RLE ROM/Strength/Tone: Deficits RLE ROM/Strength/Tone Deficits: Hip Flexion, Knee extension/flexion: 4/5; Ankle DF: 3+/5, PF: 4/5 RLE Sensation: WFL - Light Touch Left Lower Extremity Assessment LLE ROM/Strength/Tone: Within functional levels LLE Sensation: WFL - Light Touch   Balance    End of Session PT - End of Session Equipment Utilized During Treatment: Gait belt Activity Tolerance: Patient tolerated treatment well Patient left: in chair;with call bell/phone within reach;with family/visitor present Nurse Communication: Mobility status   Milana Kidney 02/29/2012, 5:00 PM  02/29/2012 Milana Kidney DPT PAGER: [phone] OFFICE: [phone]

## 2012-02-29 NOTE — H&P (Signed)
Internal Medicine Teaching Service Attending Note Date: 02/29/2012  Patient name: Andrea Cortez  Medical record number: [id number]  Date of birth: [birth year]   I have seen and evaluated Andrea Cortez and discussed their care with the Residency Team. Please see Dr Shaune Spittle H&P for full details. I agree with the formulated Assessment and Plan with the following changes:   Andrea Cortez is a woman with 3 past CVA's that left her with L UE & LE wekaness and mild dysarthria. She woke up on day of admission and was able to site SOB but when she went to stand up she fell forward on her face. Her husband tried to get her up immediately but she elected the lay there for 30 min. When her daughter noticed that her speech was worse than usual, EMS was called. Since pt was not able to know when her sxs started as they were present upon awakening, code stroke was cancelled and pt was not a TPA candidate. CT was negative for acute changes. MRI shows no acute changes although does show old CVA's.   Pt is on plavix 75 BID along with ASA. This regimen was from her PCP. She has issues remembering to take her meds and gets confused with med changes.   PMHx: 1. CVA x3 2. HLD 3. Sz - 2/2 CVA in 2004 4. Depression 5. Tobacco use - quit in 2004 after CVA  Social hx - she lives with her husband and their daughter. Her other daughter is studying in the health care field and will move in with her this weekend to assist in her care. She needs assistance bathing. She is able to clean her house. She was an electric WC but will not fit through door. Uses cane in house along with furniture. Uses cart at store.  PE :  130/84, 96, 97.4, 15, 95% RA GEN NAD. Able to sit SOB I. Talkative HRRR no MRG LCTAB ABD benign NEURO ; No facial droop, smile symmetric. Mild dysarthria. Unable to life R arm above 90 2/2 ortho injury. Strength decreased on R UE and LE but at baseline per pt.  A/P 1. TIA - Dr Pearlean Brownie has simplified  anti-plt to Plavix 75 QD. PT/OT consult. Carotids P. To get ECHO.  2. HLD - direct LDL 97. Continue statin   3. Depression - continue venlafaxine  4. Diarrhea - this has been ongoing for 6 months and is postprandial. Will occur at night if she eats prior to bed. Has tried to alter diet and cut out BP, eggs, cheese but not successful in controlling sxs. Stool is watery. Noticed this after plavix was increased to BID. This is not listed as a side effect. Only Zonisamide has D listed as a side effect and then only about 5%. She has never had a colonoscopy but not anemic. No recent hospitalization nor ABX.  Possibilities in her inc cancer, Celiac, microscopic colitis, IBS D prominent. Will check TTG, B12 and ferritin and Vit D to check for malabsorption. Trial off diary. Outpt F/U and Colonoscopy with random colon bxs.

## 2012-02-29 NOTE — Progress Notes (Signed)
SUBJECTIVE  Andrea Cortez is a 56 y.o. female who went to bed last night at baseline having last spoken to her daughter at 70. This morning awakened and when she attempted to get out of bed she fell. Her husband attempted to help her up but the patient told him to leave her alone so she remained on the floor for a prolonged period of time. Eventually the daughter was called. When she went to see the patient she felt there was a new facial droop and that her speech was worse than baseline. EMS was called at that time and patient was brought in as a code stroke.  Patient has had three strokes in the past. Two of them affected the right side and one affected the left. At baseline she had full sensation on the right and speech was a little dysarthric. She has been on ASA and Plavix in the past. Currently takes 150mg  of Plavix and a 81mg  aspirin. She was given the option of Aggrenox in the past but it was too expensive.  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 OBJECTIVE Most recent Vital Signs: Temp: 98.2 F (36.8 C) (05/26 0600) Temp src: Oral (05/26 0600) BP: 140/82 mmHg (05/26 0600) Pulse Rate: 75  (05/26 0600) Respiratory Rate: 19 O2 Saturdation: 94%  CBG (last 3)   Basename 02/28/12 1226  GLUCAP 132*   Intake/Output from previous day:    IV Fluid Intake:    Diet:  Cardiac   Activity:  Up with assistance   DVT Prophylaxis:  SCds  Studies: CBC    Component Value Date/Time   WBC 5.4 02/28/2012 1158   RBC 4.78 02/28/2012 1158   HGB 15.0 02/28/2012 1207   HCT 44.0 02/28/2012 1207   PLT 312 02/28/2012 1158   MCV 86.0 02/28/2012 1158   MCH 27.0 02/28/2012 1158   MCHC 31.4 02/28/2012 1158   RDW 13.7 02/28/2012 1158   LYMPHSABS 2.1 02/28/2012 1158   MONOABS 0.4 02/28/2012 1158   EOSABS 0.2 02/28/2012 1158   BASOSABS 0.1 02/28/2012 1158   CMP    Component Value Date/Time   NA 135  02/29/2012 0214   K 3.4* 02/29/2012 0214   CL 97 02/29/2012 0214   CO2 27 02/29/2012 0214   GLUCOSE 133* 02/29/2012 0214   BUN 13 02/29/2012 0214   CREATININE 0.80 02/29/2012 0214   CALCIUM 9.3 02/29/2012 0214   PROT 7.5 02/28/2012 1158   ALBUMIN 3.9 02/28/2012 1158   AST 29 02/28/2012 1158   ALT 23 02/28/2012 1158   ALKPHOS 120* 02/28/2012 1158   BILITOT 0.2* 02/28/2012 1158   GFRNONAA 80* 02/29/2012 0214   GFRAA >90 02/29/2012 0214   COAGS Lab Results  Component Value Date   INR 0.94 02/28/2012   Lipid Panel    Component Value Date/Time   CHOL 168 02/29/2012 0214   CHOL 170 02/29/2012 0214   TRIG 412* 02/29/2012 0214   TRIG 412* 02/29/2012 0214   HDL 36* 02/29/2012 0214   HDL 35* 02/29/2012 0214   CHOLHDL 4.7 02/29/2012 0214   CHOLHDL 4.9 02/29/2012 0214   VLDL UNABLE TO CALCULATE IF TRIGLYCERIDE OVER 400 mg/dL 1/61/0960 4540   VLDL UNABLE TO CALCULATE IF TRIGLYCERIDE OVER 400 mg/dL 9/81/1914 7829   LDLCALC UNABLE TO CALCULATE IF TRIGLYCERIDE OVER 400 mg/dL 5/62/1308 6578   LDLCALC UNABLE TO CALCULATE IF TRIGLYCERIDE OVER 400 mg/dL 4/69/6295 2841  HgbA1C  No results found for this basename: HGBA1C   Urine Drug Screen  No results found for this basename: labopia, cocainscrnur, labbenz, amphetmu, thcu, labbarb    Alcohol Level No results found for this basename: eth     Dg Chest 2 View  02/28/2012  *RADIOLOGY REPORT*  Clinical Data: Acute stroke.  CHEST - 2 VIEW  Comparison:  07/03/2011  Findings:  The heart size and mediastinal contours are within normal limits.  Both lungs are clear.  The visualized skeletal structures are unremarkable.  IMPRESSION: No active cardiopulmonary disease.  Original Report Authenticated By: Danae Orleans, M.D.   Ct Head Wo Contrast  02/28/2012  *RADIOLOGY REPORT*  Clinical Data: Left-sided weakness and slurred speech.  CT HEAD WITHOUT CONTRAST  Technique:  Contiguous axial images were obtained from the base of the skull through the vertex without contrast.   Comparison: 07/03/2011  Findings: There is no acute intracranial hemorrhage, infarction, or mass lesion.  Old stroke in the right thalamus as well as an extensive old stroke in the left middle cerebral artery distribution with secondary encephalomalacia.  No acute abnormalities.  No osseous abnormalities of significance.  IMPRESSION: Old strokes.  No acute abnormalities.  Original Report Authenticated By: Gwynn Burly, M.D.   Mr Angiogram Head Wo Contrast  02/28/2012  *RADIOLOGY REPORT*  Clinical Data:  A aphasia.  Weakness.  MRI HEAD WITHOUT CONTRAST MRA HEAD WITHOUT CONTRAST  Technique:  Multiplanar, multiecho pulse sequences of the brain and surrounding structures were obtained without intravenous contrast. Angiographic images of the head were obtained using MRA technique without contrast.  Comparison:  Head CT same day.  Previous MRI 08/13/2009.  MRI HEAD  Findings:  Diffusion imaging does not show any acute or subacute infarction.  The brainstem is normal except for Wallerian degeneration on the left.  No focal cerebellar insult.  The cerebral hemispheres show an old lacunar infarction in the right thalamus, chronic small vessel changes within the deep white matter and an old left middle cerebral artery territory stroke that has progressed to atrophy encephalomalacia with adjacent gliosis.  No mass lesion, hemorrhage, hydrocephalus or extra-axial collection. There is flow in the major vessels at the base of the brain.  No pituitary mass.  There is mild mucosal inflammation of the right maxillary sinus.  IMPRESSION: No acute infarction.  Old left MCA territory stroke.  Chronic white matter small vessel disease.  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  MRA HEAD  Findings: Both internal carotid arteries are widely patent into the brain.  The anterior and middle cerebral vessels are patent without proximal stenosis, aneurysm or vascular malformation. There is a missing MCA branch on the left consistent with the old  infarction in that region.  Both vertebral arteries are patent to the basilar. No basilar stenosis.  Posterior circulation branch vessels appear normal.  IMPRESSION: Normal intracranial MR angiography of the large and medium-sized vessels with exception that there is a missing the left MCA branch consistent with the old infarction in that distribution.  Original Report Authenticated By: Thomasenia Sales, M.D.   Mr Brain Wo Contrast  02/28/2012  *RADIOLOGY REPORT*  Clinical Data:  A aphasia.  Weakness.  MRI HEAD WITHOUT CONTRAST MRA HEAD WITHOUT CONTRAST  Technique:  Multiplanar, multiecho pulse sequences of the brain and surrounding structures were obtained without intravenous contrast. Angiographic images of the head were obtained using MRA technique without contrast.  Comparison:  Head CT same day.  Previous MRI 08/13/2009.  MRI HEAD  Findings:  Diffusion imaging does not show any acute or subacute infarction.  The brainstem is normal except for Wallerian degeneration on the left.  No focal cerebellar insult.  The cerebral hemispheres show an old lacunar infarction in the right thalamus, chronic small vessel changes within the deep white matter and an old left middle cerebral artery territory stroke that has progressed to atrophy encephalomalacia with adjacent gliosis.  No mass lesion, hemorrhage, hydrocephalus or extra-axial collection. There is flow in the major vessels at the base of the brain.  No pituitary mass.  There is mild mucosal inflammation of the right maxillary sinus.  IMPRESSION: No acute infarction.  Old left MCA territory stroke.  Chronic white matter small vessel disease.  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  MRA HEAD  Findings: Both internal carotid arteries are widely patent into the brain.  The anterior and middle cerebral vessels are patent without proximal stenosis, aneurysm or vascular malformation. There is a missing MCA branch on the left consistent with the old infarction in that region.  Both  vertebral arteries are patent to the basilar. No basilar stenosis.  Posterior circulation branch vessels appear normal.  IMPRESSION: Normal intracranial MR angiography of the large and medium-sized vessels with exception that there is a missing the left MCA branch consistent with the old infarction in that distribution.  Original Report Authenticated By: Thomasenia Sales, M.D.           2D Echocardiogram:  pending Carotid Doppler: pending     EKG: SINUS RHYTHM ~ normal P axis, V-rate 50- 99 INCOMPLETE RBBB AND LAFB ~ axis(240,-40), S>R II III aVF BASELINE WANDER IN LEAD(S) V2,V3 Physical Exam:  Pleasant obese middle aged Caucasian lady not in distress.Awake alert. Afebrile. Head is nontraumatic. Neck is supple without bruit. Hearing is normal. Cardiac exam no murmur or gallop. Lungs are clear to auscultation. Distal pulses are well felt.   Neurological Exam : Awake alert oriented x 3 speech without aphasia but mild dysarthria.Mild right lower face weakness. Mild right homonymous heminanopsia.spastic right hemiplegia with grade 3-4/5 strength.spaticity at right elbow and knee, decrease sensation on right side.impaired coordination on right.normal strength on left side. Spastic hemiplegic gait.                            ASSESSMENT Ms. Shirlyn Savin is a 56 y.o. female with a  Remote LMCA infract with spastic rt hemiplegia with transient worsening of old deficits without new infarct on MRI secondary to  Probable dehydration or UTI Stroke risk factors:  hyperlipidemia and hypertension  Hospital day # 1  TREATMENT/PLAN Continue clopidogrel 75 mg orally every day for stroke prevention.  DC aspirin as I see no benefit of long term dual antiplatelet therapy of plavix bid  for secondary stroke prevention.Check dopplers and Echo.Ranae Palms, PT and OT  Gates Rigg, MD [facility] Pager: [phone] 02/29/2012 11:11 AM

## 2012-02-29 NOTE — Progress Notes (Signed)
INTERNAL MEDICINE TEACHING SERVICE Interval Progress Note  In response the the below mentioned results, we will need to adjust her medical therapy to provider better cardiac and stroke risk factor modification.  Lab Results  Component Value Date   CHOL 168 02/29/2012   CHOL 170 02/29/2012   HDL 36* 02/29/2012   HDL 35* 02/29/2012   LDLCALC UNABLE TO CALCULATE IF TRIGLYCERIDE OVER 400 mg/dL 4/54/0981   LDLCALC UNABLE TO CALCULATE IF TRIGLYCERIDE OVER 400 mg/dL 1/91/4782   LDLDIRECT 97 02/29/2012   TRIG 412* 02/29/2012   TRIG 412* 02/29/2012   CHOLHDL 4.7 02/29/2012   CHOLHDL 4.9 02/29/2012   Comprehensive Metabolic Panel:    Component Value Date/Time   NA 135 02/29/2012 0214   K 3.4* 02/29/2012 0214   CL 97 02/29/2012 0214   CO2 27 02/29/2012 0214   BUN 13 02/29/2012 0214   CREATININE 0.80 02/29/2012 0214   GLUCOSE 133* 02/29/2012 0214   CALCIUM 9.3 02/29/2012 0214   AST 29 02/28/2012 1158   ALT 23 02/28/2012 1158   ALKPHOS 120* 02/28/2012 1158   BILITOT 0.2* 02/28/2012 1158   PROT 7.5 02/28/2012 1158   ALBUMIN 3.9 02/28/2012 1158   Lab Results  Component Value Date   HGBA1C 7.2* 02/29/2012    Assessment / Plan: 1) Newly diagnosed diabetes mellitus, type 2 - HgA1c of 7.2. May explain the worsening polyuria/ mild dehydration on admission.  - Will provide diabetes education - consult DM RN educators - Will start CBG monitoring - Change diet to Carb mod - Start metformin - Check microalb/ cr --> will consider to add ACE-I  2) Hyperlipidemia - LDL calc was 97, her new goal will be < 70 given her stroke hx and DMII. - Will escalate her statin dosage - atorvastatin 80mg  now, but will dc with Crestor.   Signed:  Johnette Abraham, D.ODonnajean Lopes, Internal Medicine Resident Pager: [phone] (7AM-5PM) 02/29/2012, 3:33 PM

## 2012-03-01 LAB — GLUCOSE, CAPILLARY
Glucose-Capillary: 138 mg/dL — ABNORMAL HIGH (ref 70–99)
Glucose-Capillary: 118 mg/dL — ABNORMAL HIGH (ref 70–99)
Glucose-Capillary: 124 mg/dL — ABNORMAL HIGH (ref 70–99)

## 2012-03-01 LAB — BASIC METABOLIC PANEL
Calcium: 8.8 mg/dL (ref 8.4–10.5)
GFR calc non Af Amer: >90 mL/min (ref >90)
Glucose, Bld: 133 mg/dL — ABNORMAL HIGH (ref 70–99)
Sodium: 137 mEq/L (ref 135–145)

## 2012-03-01 MED ORDER — PANTOPRAZOLE SODIUM 40 MG PO TBEC: 40.0000 mg | DELAYED_RELEASE_TABLET | Freq: Every day | ORAL | Status: DC

## 2012-03-01 MED ORDER — CLOPIDOGREL BISULFATE 75 MG PO TABS: 75.0000 mg | ORAL_TABLET | Freq: Every day | ORAL | Status: AC

## 2012-03-01 MED ORDER — PNEUMOCOCCAL VAC POLYVALENT 25 MCG/0.5ML IJ INJ
0.5000 mL | INJECTION | Freq: Once | INTRAMUSCULAR | Status: DC
  Filled 2012-03-01: qty 0.5

## 2012-03-01 MED ORDER — ROSUVASTATIN CALCIUM 40 MG PO TABS: 40.0000 mg | ORAL_TABLET | Freq: Every day | ORAL | Status: DC

## 2012-03-01 MED ORDER — METFORMIN HCL 500 MG PO TABS: 500.0000 mg | ORAL_TABLET | Freq: Every day | ORAL | Status: DC

## 2012-03-01 MED ORDER — PNEUMOCOCCAL VAC POLYVALENT 25 MCG/0.5ML IJ INJ: 0.5000 mL | INJECTION | INTRAMUSCULAR | Status: DC

## 2012-03-01 NOTE — Progress Notes (Signed)
   CARE MANAGEMENT NOTE 03/01/2012  Patient:  Andrea Cortez, Andrea Cortez   Account Number:  [id number]  Date Initiated:  03/01/2012  Documentation initiated by:  Darlyne Russian  Subjective/Objective Assessment:   Patient admitted with TIA     Action/Plan:   Progression of care and discharge planning   Anticipated DC Date:  03/01/2012   Anticipated DC Plan:  HOME W HOME HEALTH SERVICES      DC Planning Services  CM consult      [facility] Choice  HOME HEALTH   Choice offered to / List presented to:  C-1 Patient   DME arranged  CANE      DME agency  [facility]     HH arranged  HH-2 PT  HH-3 OT      [facility] agency  [facility] HEALTH   Status of service:  In process, will continue to follow Medicare Important Message given?   (If response is "NO", the following Medicare IM given date fields will be blank) Date Medicare IM given:   Date Additional Medicare IM given:    Discharge Disposition:    Per UR Regulation:    If discussed at Long Length of Stay Meetings, dates discussed:    Comments:  03/01/2012 1000 Darlyne Russian RN, [place] [phone] Met with patient to discuss CM and discharge planning. She currently has a monthly RN visit from [facility] and requests to continue for home PT.  She has an appointment with [facility] on Friday with Dr Alden Server, her daughter drives her to appointments. CM to continue to follow for discharge planning.  [facility] [phone]: Spoke with Eber Jones. She verified patient is active for monthly RN visits. referral for home PT and OT FAX: [phone] home health order, copy of H&P

## 2012-03-01 NOTE — Progress Notes (Signed)
Physical Therapy Treatment Patient Details Name: Andrea Cortez MRN: [id number] DOB: 1956/07/07 Today's Date: 03/01/2012 Time: 9528-4132 PT Time Calculation (min): 22 min  PT Assessment / Plan / Recommendation Comments on Treatment Session  pt presents with TIA.  pt with improved mobility today and anticipates D/C to home today.      Follow Up Recommendations  Home health PT;Supervision/Assistance - 24 hour    Barriers to Discharge        Equipment Recommendations  Small-based quad cane (pt notes QC at home is 56yrs old and wore out.  )    Recommendations for Other Services    Frequency Min 4X/week   Plan Discharge plan remains appropriate;Frequency remains appropriate    Precautions / Restrictions Precautions Precautions: Fall Restrictions Weight Bearing Restrictions: No   Pertinent Vitals/Pain Denies    Mobility  Bed Mobility Bed Mobility: Not assessed Transfers Transfers: Sit to Stand;Stand to Sit Sit to Stand: 5: Supervision;With upper extremity assist;From bed;From toilet Stand to Sit: 5: Supervision;With upper extremity assist;To bed;To toilet Details for Transfer Assistance: Good use of UEs and controlling descent today Ambulation/Gait Ambulation/Gait Assistance: 5: Supervision Ambulation Distance (Feet): 250 Feet Assistive device: Large base quad cane Ambulation/Gait Assistance Details: better sequencing today, occasionally needs to stop and restart gait to prevent getting off sequence as distractions in external environment increase.   Gait Pattern: Step-to pattern;Decreased stance time - right;Decreased step length - left;Decreased hip/knee flexion - right;Decreased dorsiflexion - right;Trunk flexed Stairs: No Wheelchair Mobility Wheelchair Mobility: No Modified Rankin (Stroke Patients Only) Pre-Morbid Rankin Score: Moderate disability Modified Rankin: Moderately severe disability    Exercises     PT Diagnosis:    PT Problem List:   PT Treatment  Interventions:     PT Goals Acute Rehab PT Goals Time For Goal Achievement: 03/07/12 Potential to Achieve Goals: Good PT Goal: Sit to Stand - Progress: Progressing toward goal PT Goal: Stand to Sit - Progress: Progressing toward goal PT Transfer Goal: Bed to Chair/Chair to Bed - Progress: Progressing toward goal PT Goal: Ambulate - Progress: Met  Visit Information  Last PT Received On: 03/01/12 Assistance Needed: +1    Subjective Data  Subjective: They're gonna let me go today.     Cognition  Overall Cognitive Status: History of cognitive impairments - at baseline Arousal/Alertness: Awake/alert Orientation Level: Appears intact for tasks assessed Behavior During Session: [facility] for tasks performed    Balance  Balance Balance Assessed: Yes Static Standing Balance Static Standing - Balance Support: No upper extremity supported Static Standing - Level of Assistance: 5: Stand by assistance Static Standing - Comment/# of Minutes: During UE task able to maintain balance with only S.    End of Session PT - End of Session Equipment Utilized During Treatment: Gait belt Activity Tolerance: Patient tolerated treatment well Patient left: in bed;with call bell/phone within reach (Sitting EOB) Nurse Communication: Mobility status    Sunny Schlein, [place] [phone] 03/01/2012, 10:15 AM

## 2012-03-01 NOTE — Progress Notes (Signed)
OT Cancellation Note  Treatment cancelled today due to patient receiving procedure. Will re-attempt as time allows today.  03/01/2012 Cipriano Mile OTR/L Pager [phone] Office [phone]

## 2012-03-01 NOTE — Progress Notes (Signed)
Pt discharged to home with husband. Home health PT and OT to follow up. All prescriptions and follow up appointments reviewed. Elmer Sow, RN

## 2012-03-01 NOTE — Discharge Instructions (Signed)
Thank you for allowing [place] to be involved in your healthcare while you were hospitalized at [facility].   Please note that there have been changes to your home medications. Please look at your discharge medication list for details.  Please call the Internal Medicine Outpatient Clinic at [phone] if you have any questions or concerns, or any difficulty getting any of your medications.  Please return to the ER if you have worsening of your symptoms or new severe symptoms arise.  You were hospitalized for worsening symptoms from your previous strokes. You experienced weakness in your legs, right sided facial weakness and decreased sensation to touch on your right face as well as some worsening of your speech. When you were brought to the ED you were given a CT scan and MRI of your head which both revealed no new strokes. Your symptoms of slurred speech, decreased facial sensation, and weakness in your legs resolved slowly as you were in the hospital. It was determined that you did not have a new stroke.  Your blood sugars were also elevated and it was determined that you have Type 2 Diabetes. This means your pancreas is still making some insulin but your body is resistant to some of the insulin, which can cause your blood sugars to remain high. We have started you on a new medication (metformin) for the diabetes and it is very important that you follow up with your new primary care doctor once you are discharged from the hospital. Your new doctor will be able to adjust your medications as needed.  #1) Slurred speech and facial droop - You did not have a new stroke. Your symptoms were temporary effects of the old stroke in your brain. If you should have any symptoms like this again you should always call 911.  #2) Type 2 Diabetes Mellitus - Your Hemoglobin A1C was 7.2, which is a blood test that is measured to monitor diabetes. Your new medication metformin should help to bring this  number back down close to the normal range however it may be necessary in the future to add more medications or to use insulin. Your new primary care doctor will discuss these topics with you and answer any questions you may have.   Diabetes, Type 2 Diabetes is a long-lasting (chronic) disease. In type 2 diabetes, the pancreas does not make enough insulin (a hormone), and the body does not respond normally to the insulin that is made. This type of diabetes was also previously called adult-onset diabetes. It usually occurs after the age of 11, but it can occur at any age.  CAUSES  Type 2 diabetes happens because the pancreasis not making enough insulin or your body has trouble using the insulin that your pancreas does make properly. SYMPTOMS   Drinking more than usual.   Urinating more than usual.   Blurred vision.   Dry, itchy skin.   Frequent infections.   Feeling more tired than usual (fatigue).  DIAGNOSIS The diagnosis of type 2 diabetes is usually made by one of the following tests:  Fasting blood glucose test. You will not eat for at least 8 hours and then take a blood test.   Random blood glucose test. Your blood glucose (sugar) is checked at any time of the day regardless of when you ate.   Oral glucose tolerance test (OGTT). Your blood glucose is measured after you have not eaten (fasted) and then after you drink a glucose containing beverage.  TREATMENT  Healthy eating.   Exercise.   Medicine, if needed.   Monitoring blood glucose.   Seeing your caregiver regularly.  HOME CARE INSTRUCTIONS   Check your blood glucose at least once a day. More frequent monitoring may be necessary, depending on your medicines and on how well your diabetes is controlled. Your caregiver will advise you.   Take your medicine as directed by your caregiver.   Do not smoke.   Make wise food choices. Ask your caregiver for information. Weight loss can improve your diabetes.   Learn  about low blood glucose (hypoglycemia) and how to treat it.   Get your eyes checked regularly.   Have a yearly physical exam. Have your blood pressure checked and your blood and urine tested.   Wear a pendant or bracelet saying that you have diabetes.   Check your feet every night for cuts, sores, blisters, and redness. Let your caregiver know if you have any problems.  SEEK MEDICAL CARE IF:   You have problems keeping your blood glucose in target range.   You have problems with your medicines.   You have symptoms of an illness that do not improve after 24 hours.   You have a sore or wound that is not healing.   You notice a change in vision or a new problem with your vision.   You have a fever.  MAKE SURE YOU:  Understand these instructions.   Will watch your condition.   Will get help right away if you are not doing well or get worse.  Document Released: 09/22/2005 Document Revised: 09/11/2011 Document Reviewed: 03/10/2011 [facility] Patient Information 2012 [place], [place].

## 2012-03-01 NOTE — Progress Notes (Signed)
Occupational Therapy Treatment Patient Details Name: Andrea Cortez MRN: [id number] DOB: 1956/02/24 Today's Date: 03/01/2012 Time: 1191-4782 OT Time Calculation (min): 15 min  OT Assessment / Plan / Recommendation Comments on Treatment Session Pt was fitted with resting hand splint from  [employer] to decrease further contracture development. Splint assessed. Educated pt/husband on correct positionng and contraindications of splinting.. Verbalized understanding of gradually increasing wearing time.. Pt to follow up with [facility] to follow up with splinting/positioning.    Follow Up Recommendations  Home health OT    Barriers to Discharge   none    Equipment Recommendations  Small-based quad cane    Recommendations for Other Services Other (comment)  Frequency Min 2X/week   Plan Discharge plan remains appropriate    Precautions / Restrictions Precautions Precautions: Fall Required Braces or Orthoses: Other Brace/Splint Restrictions Weight Bearing Restrictions: No   Pertinent Vitals/Pain none    ADL  ADL Comments: PT educated on ositioning and wearing time for resting hand splint.    OT Goals Acute Rehab OT Goals OT Goal Formulation: With patient Time For Goal Achievement: 03/07/12 Potential to Achieve Goals: Good Miscellaneous OT Goals Miscellaneous OT Goal #1: Pt will be fitted with R modified resting hand splint to prevent further contracture development. OT Goal: Miscellaneous Goal #1 - Progress: Met Miscellaneous OT Goal #2: pt'family will be independent with splint wearing protocal. OT Goal: Miscellaneous Goal #2 - Progress: Met  Visit Information  Last OT Received On: 03/01/12                                  End of Session  Ready for D/C   Sergey Ishler,HILLARY 03/01/2012, 1:40 PM [facility], OTR/L  [phone] 03/01/2012

## 2012-03-01 NOTE — Progress Notes (Signed)
Medical Student Daily Progress Note  Subjective: Patient is doing well this morning. She is still sleepy from the night but overall doing well. She has been reading about her new diagnosis of DMII and has lots of reading material for herself and her family. She has not had any diarrhea since noon yesterday however she reports she did not each much yesterday, just small portions. She denies any fevers, chills, night sweats, N/V, or any progression of her neuro deficits.   Objective: Vital signs in last 24 hours: Filed Vitals:   02/29/12 1800 02/29/12 2135 03/01/12 0100 03/01/12 0628  BP: 170/93 123/78 152/87 144/77  Pulse: 82 70 65 69  Temp: 98.1 F (36.7 C) 97.7 F (36.5 C) 97.7 F (36.5 C) 97 F (36.1 C)  TempSrc: Oral Oral Oral Oral  Resp: 18 18 20 20   Height:      Weight:      SpO2: 97% 94% 96% 93%   Weight change:  No intake or output data in the 24 hours ending 03/01/12 0745 Physical Exam: BP 144/77  Pulse 69  Temp(Src) 97 F (36.1 C) (Oral)  Resp 20  Ht 5\' 6"  (1.676 m)  Wt 104.463 kg (230 lb 4.8 oz)  BMI 37.17 kg/m2  SpO2 93% General appearance: alert, cooperative and no distress Head: Normocephalic, without obvious abnormality, atraumatic Neck: no adenopathy, no carotid bruit, no JVD, supple, symmetrical, trachea midline and thyroid not enlarged, symmetric, no tenderness/mass/nodules Back: symmetric, no curvature. ROM normal. No CVA tenderness. Lungs: clear to auscultation bilaterally Heart: regular rate and rhythm, S1, S2 normal, no murmur, click, rub or gallop Abdomen: soft, non-tender; bowel sounds normal; no masses,  no organomegaly Extremities: extremities normal, atraumatic, no cyanosis or edema and varicose veins noted Skin: Skin color, texture, turgor normal. No rashes or lesions Neurologic: Grossly normal; back to baseline for her speech / right arm / right leg Lab Results: Basic Metabolic Panel:  Lab 03/01/12 0981 02/29/12 0214  NA 137 135  K 4.1 3.4*   CL 101 97  CO2 25 27  GLUCOSE 133* 133*  BUN 10 13  CREATININE 0.75 0.80  CALCIUM 8.8 9.3  MG -- --  PHOS -- --   Liver Function Tests:  Lab 02/28/12 1158  AST 29  ALT 23  ALKPHOS 120*  BILITOT 0.2*  PROT 7.5  ALBUMIN 3.9   No results found for this basename: LIPASE:2,AMYLASE:2 in the last 168 hours No results found for this basename: AMMONIA:2 in the last 168 hours CBC:  Lab 02/29/12 1633 02/28/12 1207 02/28/12 1158  WBC 5.5 -- 5.4  NEUTROABS 2.7 -- 2.6  HGB 13.5 15.0 --  HCT 41.4 44.0 --  MCV 85.9 -- 86.0  PLT 320 -- 312   Cardiac Enzymes:  Lab 02/29/12 0840 02/29/12 0211 02/28/12 2033  CKTOTAL 118 114 111  CKMB 1.7 1.6 1.7  CKMBINDEX -- -- --  TROPONINI <0.30 <0.30 <0.30   BNP: No results found for this basename: PROBNP:3 in the last 168 hours D-Dimer: No results found for this basename: DDIMER:2 in the last 168 hours CBG:  Lab 03/01/12 0642 02/29/12 2130 02/29/12 1635 02/29/12 1128 02/28/12 1226  GLUCAP 118* 138* 115* 122* 132*   Hemoglobin A1C:  Lab 02/29/12 0214  HGBA1C 7.2*   Fasting Lipid Panel:  Lab 02/29/12 0840 02/29/12 0214  CHOL -- 168170  HDL -- 36*35*  LDLCALC -- UNABLE TO CALCULATE IF TRIGLYCERIDE OVER 400 mg/dLUNABLE TO CALCULATE IF TRIGLYCERIDE OVER 400 mg/dL  TRIG --  865*784*  CHOLHDL -- 4.74.9  LDLDIRECT 97 --   Thyroid Function Tests:  Lab 02/29/12 1149  TSH 2.976  T4TOTAL --  FREET4 --  T3FREE --  THYROIDAB --   Coagulation:  Lab 02/28/12 1158  LABPROT 12.8  INR 0.94   Anemia Panel:  Lab 02/29/12 1149  VITAMINB12 --  FOLATE --  FERRITIN 99  TIBC --  IRON --  RETICCTPCT --   Urine Drug Screen: Drugs of Abuse  No results found for this basename: labopia, cocainscrnur, labbenz, amphetmu, thcu, labbarb    Alcohol Level: No results found for this basename: ETH:2 in the last 168 hours Urinalysis:  Lab 02/29/12 1137  COLORURINE YELLOW  LABSPEC 1.016  PHURINE 8.0  GLUCOSEU NEGATIVE  HGBUR  NEGATIVE  BILIRUBINUR NEGATIVE  KETONESUR NEGATIVE  PROTEINUR NEGATIVE  UROBILINOGEN 0.2  NITRITE NEGATIVE  LEUKOCYTESUR MODERATE*    Micro Results: No results found for this or any previous visit (from the past 240 hour(s)). Studies/Results: Dg Chest 2 View  02/28/2012  *RADIOLOGY REPORT*  Clinical Data: Acute stroke.  CHEST - 2 VIEW  Comparison:  07/03/2011  Findings:  The heart size and mediastinal contours are within normal limits.  Both lungs are clear.  The visualized skeletal structures are unremarkable.  IMPRESSION: No active cardiopulmonary disease.  Original Report Authenticated By: Danae Orleans, M.D.   Ct Head Wo Contrast  02/28/2012  *RADIOLOGY REPORT*  Clinical Data: Left-sided weakness and slurred speech.  CT HEAD WITHOUT CONTRAST  Technique:  Contiguous axial images were obtained from the base of the skull through the vertex without contrast.  Comparison: 07/03/2011  Findings: There is no acute intracranial hemorrhage, infarction, or mass lesion.  Old stroke in the right thalamus as well as an extensive old stroke in the left middle cerebral artery distribution with secondary encephalomalacia.  No acute abnormalities.  No osseous abnormalities of significance.  IMPRESSION: Old strokes.  No acute abnormalities.  Original Report Authenticated By: Gwynn Burly, M.D.   Mr Angiogram Head Wo Contrast  02/28/2012  *RADIOLOGY REPORT*  Clinical Data:  A aphasia.  Weakness.  MRI HEAD WITHOUT CONTRAST MRA HEAD WITHOUT CONTRAST  Technique:  Multiplanar, multiecho pulse sequences of the brain and surrounding structures were obtained without intravenous contrast. Angiographic images of the head were obtained using MRA technique without contrast.  Comparison:  Head CT same day.  Previous MRI 08/13/2009.  MRI HEAD  Findings:  Diffusion imaging does not show any acute or subacute infarction.  The brainstem is normal except for Wallerian degeneration on the left.  No focal cerebellar insult.   The cerebral hemispheres show an old lacunar infarction in the right thalamus, chronic small vessel changes within the deep white matter and an old left middle cerebral artery territory stroke that has progressed to atrophy encephalomalacia with adjacent gliosis.  No mass lesion, hemorrhage, hydrocephalus or extra-axial collection. There is flow in the major vessels at the base of the brain.  No pituitary mass.  There is mild mucosal inflammation of the right maxillary sinus.  IMPRESSION: No acute infarction.  Old left MCA territory stroke.  Chronic white matter small vessel disease.  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  MRA HEAD  Findings: Both internal carotid arteries are widely patent into the brain.  The anterior and middle cerebral vessels are patent without proximal stenosis, aneurysm or vascular malformation. There is a missing MCA branch on the left consistent with the old infarction in that region.  Both vertebral arteries are patent to  the basilar. No basilar stenosis.  Posterior circulation branch vessels appear normal.  IMPRESSION: Normal intracranial MR angiography of the large and medium-sized vessels with exception that there is a missing the left MCA branch consistent with the old infarction in that distribution.  Original Report Authenticated By: Thomasenia Sales, M.D.   Mr Brain Wo Contrast  02/28/2012  *RADIOLOGY REPORT*  Clinical Data:  A aphasia.  Weakness.  MRI HEAD WITHOUT CONTRAST MRA HEAD WITHOUT CONTRAST  Technique:  Multiplanar, multiecho pulse sequences of the brain and surrounding structures were obtained without intravenous contrast. Angiographic images of the head were obtained using MRA technique without contrast.  Comparison:  Head CT same day.  Previous MRI 08/13/2009.  MRI HEAD  Findings:  Diffusion imaging does not show any acute or subacute infarction.  The brainstem is normal except for Wallerian degeneration on the left.  No focal cerebellar insult.  The cerebral hemispheres show an  old lacunar infarction in the right thalamus, chronic small vessel changes within the deep white matter and an old left middle cerebral artery territory stroke that has progressed to atrophy encephalomalacia with adjacent gliosis.  No mass lesion, hemorrhage, hydrocephalus or extra-axial collection. There is flow in the major vessels at the base of the brain.  No pituitary mass.  There is mild mucosal inflammation of the right maxillary sinus.  IMPRESSION: No acute infarction.  Old left MCA territory stroke.  Chronic white matter small vessel disease.  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  MRA HEAD  Findings: Both internal carotid arteries are widely patent into the brain.  The anterior and middle cerebral vessels are patent without proximal stenosis, aneurysm or vascular malformation. There is a missing MCA branch on the left consistent with the old infarction in that region.  Both vertebral arteries are patent to the basilar. No basilar stenosis.  Posterior circulation branch vessels appear normal.  IMPRESSION: Normal intracranial MR angiography of the large and medium-sized vessels with exception that there is a missing the left MCA branch consistent with the old infarction in that distribution.  Original Report Authenticated By: Thomasenia Sales, M.D.   Medications:  I have reviewed the patient's current medications. Prior to Admission:  Prescriptions prior to admission  Medication Sig Dispense Refill  . aspirin EC 81 MG tablet Take 81 mg by mouth daily.      . B-12, METHYLCOBALAMIN, SL Place 500 mcg under the tongue daily.      . carbamazepine (TEGRETOL) 200 MG tablet Take 200 mg by mouth 2 (two) times daily.      . clopidogrel (PLAVIX) 75 MG tablet Take 75 mg by mouth 2 (two) times daily.      . niacin 500 MG tablet Take 500 mg by mouth daily with breakfast.      . OMEGA 3 1200 MG CAPS Take 1 capsule by mouth daily.      . rosuvastatin (CRESTOR) 20 MG tablet Take 20 mg by mouth every evening.      .  venlafaxine (EFFEXOR) 75 MG tablet Take 75 mg by mouth 2 (two) times daily.       Marland Kitchen zonisamide (ZONEGRAN) 100 MG capsule Take 200 mg by mouth at bedtime.        Scheduled Meds:   . atorvastatin  80 mg Oral q1800  . clopidogrel  75 mg Oral Q breakfast  . enoxaparin  40 mg Subcutaneous Q24H  . insulin aspart  0-9 Units Subcutaneous TID WC  . metFORMIN  500 mg Oral  Q breakfast  . pantoprazole  40 mg Oral Q1200  . venlafaxine  75 mg Oral BID  . zonisamide  200 mg Oral QHS  . DISCONTD: aspirin EC  81 mg Oral Daily  . DISCONTD: atorvastatin  40 mg Oral q1800   Continuous Infusions:  PRN Meds:.senna-docusate Assessment/Plan:  Acute neuro deficits - Patients admission symptoms have resolved back to baseline. Discussion with neurology calls this episode a natural long term consequence of her old stroke with progressive deteoriation of the brain parenchyma around old infarct. CT and MRI brain show no new acute events. Her prelim carotid dopplers were clear. - appreciate neurology consult in the management of this patient - f/u with neuro at d/c - continue aspirin and plavix  DMII - New diagnosis on 02/29/12. A1c=7.2. She is started on metformin and will need follow-up with her new PCP that she will see at discharge. She has been given some Secretary/administrator. DM RN educators off for holiday so unable to get consult.  - continue metformin - check microalbumin - may benefit from an ACEi, will leave this concern in her d/c summary for evaluation by her new PCP  Hyperlipidemia - Her direct LDL = 97. Unknown compliance of her home lipid medications.  - new LDL goal <70 - increase  lipitor to 80 mg daily - preliminary carotid dopplers yesterday seemed clear  Diarrhea - She only had 1 episode of diarrhea yesterday at noon. However, she did not eat a lot of food yesterday either. She does not report any nausea or decrease in appetite, she says sometimes she simply isn't hungry. Of note, since her  plavix was decreased on admission to 75mg  daily, her diarrhea has decreased. - TTG and ferritin in process - TSH and Vit. D normal  Seizures - Patient developed seizures after her first stroke in 2004. She has since not had any seizures as she has also been taking zonisamide daily since then. She has no other history of sz d/o nor any family history.  - continue zonisamide 200 mg daily  - seizure precautions  Depression - Developed after her first stroke in 2004. Has been stable and well managed on her home regimen but did report yesterday that she has felt really sleepy some days at home when she takes her medicine BID so she may only take it daily sometimes. She also reported transient episodes of anger and frustration at inappropriate times and she does not know why. - continue venlafaxine 75 mg BID  - f/u with her PCP for alternative therapy  Vitamin B12 deficiency - HgB = 12.9, MCV = 86. She has a history of B12 deficiency, etiology u/k. She takes daily tablets and monthly injection of B12.  - hold vitamin B12 for now   DVT ppx - Lovenox  Disposition - She will get her 2D echo today and then will be ready for d/c today.   LOS: 2 days   This is a Psychologist, occupational Note.  The care of the patient was discussed with Dr. Priscella Mann and the assessment and plan formulated with their assistance.  Please see their attached note for official documentation of the daily encounter.  Lewie Chamber 03/01/2012, 7:45 AM

## 2012-03-01 NOTE — Progress Notes (Signed)
SUBJECTIVE  Andrea Cortez is a 56 y.o. female who went to bed last night at baseline having last spoken to her daughter at 60. This morning awakened and when she attempted to get out of bed she fell. Her husband attempted to help her up but the patient told him to leave her alone so she remained on the floor for a prolonged period of time. Eventually the daughter was called. When she went to see the patient she felt there was a new facial droop and that her speech was worse than baseline. EMS was called at that time and patient was brought in as a code stroke.  Patient has had three strokes in the past. Two of them affected the right side and one affected the left. At baseline she had full sensation on the right and speech was a little dysarthric. She has been on ASA and Plavix in the past. Currently takes 150mg  of Plavix and a 81mg  aspirin. She was given the option of Aggrenox in the past but it was too expensive.  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 OBJECTIVE Most recent Vital Signs: Temp: 98.3 F (36.8 C) (05/27 1029) Temp src: Oral (05/27 1029) BP: 158/90 mmHg (05/27 1029) Pulse Rate: 84  (05/27 1029) Respiratory Rate: 22 O2 Saturdation: 95%  CBG (last 3)   Basename 03/01/12 0642 02/29/12 2130 02/29/12 1635  GLUCAP 118* 138* 115*   Intake/Output from previous day:    IV Fluid Intake:    Diet:  Carb Control   Activity:  Up with assistance   DVT Prophylaxis:  SCds  Studies: CBC    Component Value Date/Time   WBC 5.5 02/29/2012 1633   RBC 4.82 02/29/2012 1633   HGB 13.5 02/29/2012 1633   HCT 41.4 02/29/2012 1633   PLT 320 02/29/2012 1633   MCV 85.9 02/29/2012 1633   MCH 28.0 02/29/2012 1633   MCHC 32.6 02/29/2012 1633   RDW 13.6 02/29/2012 1633   LYMPHSABS 2.1 02/29/2012 1633   MONOABS 0.6 02/29/2012 1633   EOSABS 0.2 02/29/2012 1633   BASOSABS 0.0 02/29/2012 1633   CMP    Component Value Date/Time   NA 137 03/01/2012 0500   K 4.1 03/01/2012 0500   CL 101 03/01/2012 0500   CO2 25 03/01/2012 0500   GLUCOSE 133* 03/01/2012 0500   BUN 10 03/01/2012 0500   CREATININE 0.75 03/01/2012 0500   CALCIUM 8.8 03/01/2012 0500   PROT 7.5 02/28/2012 1158   ALBUMIN 3.9 02/28/2012 1158   AST 29 02/28/2012 1158   ALT 23 02/28/2012 1158   ALKPHOS 120* 02/28/2012 1158   BILITOT 0.2* 02/28/2012 1158   GFRNONAA >90 03/01/2012 0500   GFRAA >90 03/01/2012 0500   COAGS Lab Results  Component Value Date   INR 0.94 02/28/2012   Lipid Panel    Component Value Date/Time   CHOL 168 02/29/2012 0214   CHOL 170 02/29/2012 0214   TRIG 412* 02/29/2012 0214   TRIG 412* 02/29/2012 0214   HDL 36* 02/29/2012 0214   HDL 35* 02/29/2012 0214   CHOLHDL 4.7 02/29/2012 0214   CHOLHDL 4.9 02/29/2012 0214   VLDL UNABLE TO CALCULATE IF TRIGLYCERIDE OVER 400 mg/dL 2/72/5366 4403   VLDL UNABLE TO CALCULATE IF TRIGLYCERIDE OVER 400 mg/dL 4/74/2595 6387   LDLCALC UNABLE TO CALCULATE IF TRIGLYCERIDE OVER 400 mg/dL 5/64/3329 5188  LDLCALC UNABLE TO CALCULATE IF TRIGLYCERIDE OVER 400 mg/dL 10/24/1476 2956   [id number]  Lab Results  Component Value Date   HGBA1C 7.2* 02/29/2012   Urine Drug Screen  No results found for this basename: labopia,  cocainscrnur,  labbenz,  amphetmu,  thcu,  labbarb    Alcohol Level No results found for this basename: eth     Dg Chest 2 View  02/28/2012  *RADIOLOGY REPORT*  Clinical Data: Acute stroke.  CHEST - 2 VIEW  Comparison:  07/03/2011  Findings:  The heart size and mediastinal contours are within normal limits.  Both lungs are clear.  The visualized skeletal structures are unremarkable.  IMPRESSION: No active cardiopulmonary disease.  Original Report Authenticated By: Danae Orleans, M.D.   Ct Head Wo Contrast  02/28/2012  *RADIOLOGY REPORT*  Clinical Data: Left-sided weakness and slurred speech.  CT HEAD WITHOUT CONTRAST  Technique:   Contiguous axial images were obtained from the base of the skull through the vertex without contrast.  Comparison: 07/03/2011  Findings: There is no acute intracranial hemorrhage, infarction, or mass lesion.  Old stroke in the right thalamus as well as an extensive old stroke in the left middle cerebral artery distribution with secondary encephalomalacia.  No acute abnormalities.  No osseous abnormalities of significance.  IMPRESSION: Old strokes.  No acute abnormalities.  Original Report Authenticated By: Gwynn Burly, M.D.   Mr Angiogram Head Wo Contrast  02/28/2012  *RADIOLOGY REPORT*  Clinical Data:  A aphasia.  Weakness.  MRI HEAD WITHOUT CONTRAST MRA HEAD WITHOUT CONTRAST  Technique:  Multiplanar, multiecho pulse sequences of the brain and surrounding structures were obtained without intravenous contrast. Angiographic images of the head were obtained using MRA technique without contrast.  Comparison:  Head CT same day.  Previous MRI 08/13/2009.  MRI HEAD  Findings:  Diffusion imaging does not show any acute or subacute infarction.  The brainstem is normal except for Wallerian degeneration on the left.  No focal cerebellar insult.  The cerebral hemispheres show an old lacunar infarction in the right thalamus, chronic small vessel changes within the deep white matter and an old left middle cerebral artery territory stroke that has progressed to atrophy encephalomalacia with adjacent gliosis.  No mass lesion, hemorrhage, hydrocephalus or extra-axial collection. There is flow in the major vessels at the base of the brain.  No pituitary mass.  There is mild mucosal inflammation of the right maxillary sinus.  IMPRESSION: No acute infarction.  Old left MCA territory stroke.  Chronic white matter small vessel disease.  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  MRA HEAD  Findings: Both internal carotid arteries are widely patent into the brain.  The anterior and middle cerebral vessels are patent without proximal stenosis,  aneurysm or vascular malformation. There is a missing MCA branch on the left consistent with the old infarction in that region.  Both vertebral arteries are patent to the basilar. No basilar stenosis.  Posterior circulation branch vessels appear normal.  IMPRESSION: Normal intracranial MR angiography of the large and medium-sized vessels with exception that there is a missing the left MCA branch consistent with the old infarction in that distribution.  Original Report Authenticated By: Thomasenia Sales, M.D.   Mr Brain Wo Contrast  02/28/2012  *RADIOLOGY REPORT*  Clinical Data:  A aphasia.  Weakness.  MRI HEAD WITHOUT CONTRAST MRA HEAD WITHOUT CONTRAST  Technique:  Multiplanar, multiecho pulse sequences of the brain and surrounding structures were obtained without intravenous contrast. Angiographic images of the head  were obtained using MRA technique without contrast.  Comparison:  Head CT same day.  Previous MRI 08/13/2009.  MRI HEAD  Findings:  Diffusion imaging does not show any acute or subacute infarction.  The brainstem is normal except for Wallerian degeneration on the left.  No focal cerebellar insult.  The cerebral hemispheres show an old lacunar infarction in the right thalamus, chronic small vessel changes within the deep white matter and an old left middle cerebral artery territory stroke that has progressed to atrophy encephalomalacia with adjacent gliosis.  No mass lesion, hemorrhage, hydrocephalus or extra-axial collection. There is flow in the major vessels at the base of the brain.  No pituitary mass.  There is mild mucosal inflammation of the right maxillary sinus.  IMPRESSION: No acute infarction.  Old left MCA territory stroke.  Chronic white matter small vessel disease.  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  MRA HEAD  Findings: Both internal carotid arteries are widely patent into the brain.  The anterior and middle cerebral vessels are patent without proximal stenosis, aneurysm or vascular malformation.  There is a missing MCA branch on the left consistent with the old infarction in that region.  Both vertebral arteries are patent to the basilar. No basilar stenosis.  Posterior circulation branch vessels appear normal.  IMPRESSION: Normal intracranial MR angiography of the large and medium-sized vessels with exception that there is a missing the left MCA branch consistent with the old infarction in that distribution.  Original Report Authenticated By: Thomasenia Sales, M.D.           2D Echocardiogram:  pending Carotid Doppler:No significant extracranial carotid artery stenosis demonstrated. Vertebrals are patent with antegrade flow.        EKG: SINUS RHYTHM ~ normal P axis, V-rate 50- 99 INCOMPLETE RBBB AND LAFB ~ axis(240,-40), S>R II III aVF BASELINE WANDER IN LEAD(S) V2,V3 Physical Exam:  Pleasant obese middle aged Caucasian lady not in distress.Awake alert. Afebrile. Head is nontraumatic. Neck is supple without bruit. Hearing is normal. Cardiac exam no murmur or gallop. Lungs are clear to auscultation. Distal pulses are well felt.   Neurological Exam : Awake alert oriented x 3 speech without aphasia but mild dysarthria.Mild right lower face weakness. Mild right homonymous heminanopsia.spastic right hemiplegia with grade 3-4/5 strength.spaticity at right elbow and knee, decrease sensation on right side.impaired coordination on right.normal strength on left side. Spastic hemiplegic gait.                            ASSESSMENT Ms. Shamona Wirtz is a 56 y.o. female with a  Remote LMCA infract with spastic rt hemiplegia with transient worsening of old deficits without new infarct on MRI secondary to  Probable dehydration or UTI Stroke risk factors:  hyperlipidemia and hypertension  Hospital day # 2  TREATMENT/PLAN Continue clopidogrel 75 mg orally every day for stroke prevention.  DC aspirin as I see no benefit of long term dual antiplatelet therapy of plavix bid  for secondary  stroke prevention.Check  Echo.Mobilize OOB, PT and OT.DC home later today.  Gates Rigg, MD [facility] Pager: [phone] 03/01/2012 10:56 AM

## 2012-03-01 NOTE — Progress Notes (Signed)
Resident Co-sign Daily Note: I have seen the patient and reviewed the daily progress note by Lewie Chamber and discussed the care of the patient with them.  See below for documentation of my findings, assessment, and plans.  Subjective: Feeling good today. Feels that she is at her baseline. Objective: Vital signs in last 24 hours: Filed Vitals:   02/29/12 1800 02/29/12 2135 03/01/12 0100 03/01/12 0628  BP: 170/93 123/78 152/87 144/77  Pulse: 82 70 65 69  Temp: 98.1 F (36.7 C) 97.7 F (36.5 C) 97.7 F (36.5 C) 97 F (36.1 C)  TempSrc: Oral Oral Oral Oral  Resp: 18 18 20 20   Height:      Weight:      SpO2: 97% 94% 96% 93%   Physical Exam: General: Sitting at end of bed NAD  HEENT: PERRL, EOMI, Cardiac: RRR, no rubs, murmurs or gallops Pulm: clear to auscultation bilaterally, moving normal volumes of air Ext: warm and well perfused, no pedal edema Neuro: alert and oriented X3, cranial nerves II-XII grossly intact, with the exception of cranial nerve 7 which shows mild right impairment and division V3 there is decreased sensation. Right arm decreased sensation and mobility especially within the hand. Speech is slurred though improved from admission.  Lab Results: Reviewed and documented in Electronic Record Micro Results: Reviewed and documented in Electronic Record Studies/Results: Reviewed and documented in Electronic Record Medications: I have reviewed the patient's current medications. Scheduled Meds:   . atorvastatin  80 mg Oral q1800  . clopidogrel  75 mg Oral Q breakfast  . enoxaparin  40 mg Subcutaneous Q24H  . insulin aspart  0-9 Units Subcutaneous TID WC  . metFORMIN  500 mg Oral Q breakfast  . pantoprazole  40 mg Oral Q1200  . venlafaxine  75 mg Oral BID  . zonisamide  200 mg Oral QHS  . DISCONTD: aspirin EC  81 mg Oral Daily  . DISCONTD: atorvastatin  40 mg Oral q1800   Continuous Infusions:  PRN Meds:.senna-docusate Assessment/Plan:  Neurologic deficits  /TIA - MRI was negative for acute stroke. Likely was secondary to TIA versus progression of her encephalomalacia from prior showed. Carotid Dopplers showed no stenosis and 2-D echo pending. - Continue clopidogrel 75 mg daily - Will discharge if 2-D echo shows no pathology  Diabetes mellitus2 - patient had hemoglobin A1c of 7.2 giving her new diagnosis of diabetes. We will provide diabetic teaching via the nursing staff and educational videos and discharged with a prescription for metformin, glucometer, lancets and glucose meter strips. She will need followup with her PCP regarding further management. -- Continue metformin -- SSI  Hyperlipidemia - LDL calculated at 97 which is below her goal with new diagnosis of diabetes. Will discharge on rosuvastatin and continue atorvastatin -- Continue atorvastatin. We'll discharge on rosuvastatin  Depression -stable, continue venlafaxine 75 mg twice a day  Diarrhea -  This could be irritable bowel syndrome as the patient stated she was slightly constipated yesterday but has baseline diarrhea. Unlikely to be C. difficile as she does not have enough bowel movements daily. Malabsorption unlikely with normal vitamin D level. TSH and ferritin were normal. Transglutaminase antibody sent and is pending. -- Follow transglutaminase antibody at PCPs office  Disposition - home today with PCP followup after echo   LOS: 2 days   [facility], Katrece Roediger 03/01/2012, 8:48 AM

## 2012-03-01 NOTE — Progress Notes (Signed)
  Echocardiogram 2D Echocardiogram has been performed.  Jorje Guild [facility] 03/01/2012, 8:55 AM

## 2012-03-02 LAB — MICROALBUMIN / CREATININE URINE RATIO
Creatinine, Urine: 204.1 mg/dL
Microalb Creat Ratio: 14.6 mg/g (ref 0.0–30.0)
Microalb, Ur: 2.99 mg/dL — ABNORMAL HIGH (ref 0.00–1.89)

## 2012-03-02 LAB — VITAMIN D 25 HYDROXY (VIT D DEFICIENCY, FRACTURES): Vit D, 25-Hydroxy: 24 ng/mL — ABNORMAL LOW (ref 30–89)

## 2012-03-03 LAB — TISSUE TRANSGLUTAMINASE, IGG: Tissue Transglut Ab: 6.3 U/mL (ref <20)

## 2012-03-04 LAB — VITAMIN D 1,25 DIHYDROXY
Vitamin D2 1, 25 (OH)2: <8 pg/mL
Vitamin D3 1, 25 (OH)2: 61 pg/mL

## 2012-03-06 NOTE — Discharge Summary (Signed)
Internal Medicine Teaching [facility] Discharge Note  Name: Andrea Cortez MRN: [id number] DOB: May 12, 1956 56 y.o.  Date of Admission: 02/28/2012 11:54 AM Date of Discharge: 03/01/12 /Attending Physician: Mariana Arn  Discharge Diagnosis: Principal Problem:  *TIA (transient ischemic attack) Active Problems:  Hyperlipidemia LDL goal < 70  Diabetes mellitus type 2, uncontrolled  Depression  History of seizures Diarrhea  Discharge Medications: Medication List  As of 03/06/2012 11:20 AM   STOP taking these medications         aspirin EC 81 MG tablet      B-12 (METHYLCOBALAMIN) SL      carbamazepine 200 MG tablet      niacin 500 MG tablet      OMEGA 3 1200 MG Caps         TAKE these medications         clopidogrel 75 MG tablet   Commonly known as: PLAVIX   Take 1 tablet (75 mg total) by mouth daily with breakfast. FOR HISTORY OF STROKE.      metFORMIN 500 MG tablet   Commonly known as: GLUCOPHAGE   Take 1 tablet (500 mg total) by mouth daily with breakfast. FOR DIABETES.      pantoprazole 40 MG tablet   Commonly known as: PROTONIX   Take 1 tablet (40 mg total) by mouth daily at 12 noon. FOR REFLUX.      rosuvastatin 40 MG tablet   Commonly known as: CRESTOR   Take 1 tablet (40 mg total) by mouth daily. FOR CHOLESTEROL.      venlafaxine 75 MG tablet   Commonly known as: EFFEXOR   Take 75 mg by mouth 2 (two) times daily.      zonisamide 100 MG capsule   Commonly known as: ZONEGRAN   Take 200 mg by mouth at bedtime.            Disposition and follow-up:   Andrea Cortez was discharged from [facility] in Good condition.    FOLLOW UP APPOINTMENTS Follow-up Information    Follow up with Molinda Bailiff, PA of [facility] . (Please attend your already scheduled appointment on March 10, 2012.  You will neeed to have  close followup of your newly diagnosed diabetes.)    Contact information:   [phone] [address]   [place], [place] [address]      Follow up with Gates Rigg, MD. Schedule an appointment as soon as possible for a visit in 1 week. ([facility] - Please call to schedule an appointment.)    Contact information:   [address] [facility] [place] [address] [phone]          Follow-up Appointments: Discharge Orders    Future Orders Please Complete By Expires   Diet Carb Modified      Scheduling Instructions:   And thin liquids.   Increase activity slowly      Driving Restrictions      Comments:   Do not drive.   Other Restrictions      Comments:   Ambulate with assistance only.   Call MD for:  temperature >100.4      Call MD for:  difficulty breathing, headache or visual disturbances      Call MD for:  persistant dizziness or light-headedness         At his followup appointment with her PCP, The following item should be addressed: #1 Please follow transglutaminase antibody test for  celiac disease, which was pending at discharge. #2 Please provide further diabetic teaching as the patient will require education about her new diagnosis of diabetes.  Consultations: neurology   Procedures Performed:  Dg Chest 2 View  02/28/2012  *RADIOLOGY REPORT*  Clinical Data: Acute stroke.  CHEST - 2 VIEW  Comparison:  07/03/2011  Findings:  The heart size and mediastinal contours are within normal limits.  Both lungs are clear.  The visualized skeletal structures are unremarkable.  IMPRESSION: No active cardiopulmonary disease.  Original Report Authenticated By: Danae Orleans, M.D.   Ct Head Wo Contrast  02/28/2012  *RADIOLOGY REPORT*  Clinical Data: Left-sided weakness and slurred speech.  CT HEAD WITHOUT CONTRAST  Technique:  Contiguous axial images were obtained from the base of the skull through the vertex without contrast.  Comparison: 07/03/2011  Findings: There is no acute intracranial hemorrhage, infarction, or mass  lesion.  Old stroke in the right thalamus as well as an extensive old stroke in the left middle cerebral artery distribution with secondary encephalomalacia.  No acute abnormalities.  No osseous abnormalities of significance.  IMPRESSION: Old strokes.  No acute abnormalities.  Original Report Authenticated By: Gwynn Burly, M.D.   Mr Angiogram Head Wo Contrast  02/28/2012  *RADIOLOGY REPORT*  Clinical Data:  A aphasia.  Weakness.  MRI HEAD WITHOUT CONTRAST MRA HEAD WITHOUT CONTRAST  Technique:  Multiplanar, multiecho pulse sequences of the brain and surrounding structures were obtained without intravenous contrast. Angiographic images of the head were obtained using MRA technique without contrast.  Comparison:  Head CT same day.  Previous MRI 08/13/2009.  MRI HEAD  Findings:  Diffusion imaging does not show any acute or subacute infarction.  The brainstem is normal except for Wallerian degeneration on the left.  No focal cerebellar insult.  The cerebral hemispheres show an old lacunar infarction in the right thalamus, chronic small vessel changes within the deep white matter and an old left middle cerebral artery territory stroke that has progressed to atrophy encephalomalacia with adjacent gliosis.  No mass lesion, hemorrhage, hydrocephalus or extra-axial collection. There is flow in the major vessels at the base of the brain.  No pituitary mass.  There is mild mucosal inflammation of the right maxillary sinus.  IMPRESSION: No acute infarction.  Old left MCA territory stroke.  Chronic white matter small vessel disease.  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  MRA HEAD  Findings: Both internal carotid arteries are widely patent into the brain.  The anterior and middle cerebral vessels are patent without proximal stenosis, aneurysm or vascular malformation. There is a missing MCA branch on the left consistent with the old infarction in that region.  Both vertebral arteries are patent to the basilar. No basilar stenosis.   Posterior circulation branch vessels appear normal.  IMPRESSION: Normal intracranial MR angiography of the large and medium-sized vessels with exception that there is a missing the left MCA branch consistent with the old infarction in that distribution.  Original Report Authenticated By: Thomasenia Sales, M.D.   Mr Brain Wo Contrast  02/28/2012  *RADIOLOGY REPORT*  Clinical Data:  A aphasia.  Weakness.  MRI HEAD WITHOUT CONTRAST MRA HEAD WITHOUT CONTRAST  Technique:  Multiplanar, multiecho pulse sequences of the brain and surrounding structures were obtained without intravenous contrast. Angiographic images of the head were obtained using MRA technique without contrast.  Comparison:  Head CT same day.  Previous MRI 08/13/2009.  MRI HEAD  Findings:  Diffusion imaging does not show any acute or  subacute infarction.  The brainstem is normal except for Wallerian degeneration on the left.  No focal cerebellar insult.  The cerebral hemispheres show an old lacunar infarction in the right thalamus, chronic small vessel changes within the deep white matter and an old left middle cerebral artery territory stroke that has progressed to atrophy encephalomalacia with adjacent gliosis.  No mass lesion, hemorrhage, hydrocephalus or extra-axial collection. There is flow in the major vessels at the base of the brain.  No pituitary mass.  There is mild mucosal inflammation of the right maxillary sinus.  IMPRESSION: No acute infarction.  Old left MCA territory stroke.  Chronic white matter small vessel disease.  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  MRA HEAD  Findings: Both internal carotid arteries are widely patent into the brain.  The anterior and middle cerebral vessels are patent without proximal stenosis, aneurysm or vascular malformation. There is a missing MCA branch on the left consistent with the old infarction in that region.  Both vertebral arteries are patent to the basilar. No basilar stenosis.  Posterior circulation branch  vessels appear normal.  IMPRESSION: Normal intracranial MR angiography of the large and medium-sized vessels with exception that there is a missing the left MCA branch consistent with the old infarction in that distribution.  Original Report Authenticated By: Thomasenia Sales, M.D.   Transthoracic Echocardiography Study Date: 03/01/2012  Study Conclusions Left ventricle: The cavity size was normal. Systolic function was normal. The estimated ejection fraction was in the range of 55% to 60%. Wall motion was normal; there were no regional wall motion abnormalities. Impressions: - No cardiac source of emboli was indentified.  Admission HPI:  Patient is a 56 yo woman with PMH of stroke (2004 x 2 and 2011 x 1) with residual right face reduced sensation, right arm weakness and reduced sensation and right leg weakness and reduced sensation for which she uses a cane to ambulate at home (has wheelchair but does not use it). At baseline she usually has some dysarthria and right sided facial droop. She was in her usual state of health until last night when she went to bed and upon awaking this morning from her dog barking she attempted to get out of bed. Her usual routine is to swing her legs off the side of the bed and wait a second before standing up. She does not note that her legs felt any different this morning as she swung them off the bed, however her memory is somewhat impaired about this morning. Upon standing up her legs immediately gave out from under her and she face-planted to the floor. Her husband was immediately at her side, however she told him to leave her be and finally after about 30 minutes he and their daughter picked her up off the floor. The daughter noticed that her speech was worse than usual and that there was more pronounced facial droop and called EMS. She noted some blurry vision that has resolved almost completely. She had some dizziness after her fall and currently in the ED she is not  dizzy however she says she knows she would be if she were to stand. She also admits to having constant diarrhea at least two loose, brown movements a day for "a long time". Notably, she has been up and used the bathroom with assistance while in the ED. She denies any hearing or olfactory loss, nausea, coughs, fevers, chills, or night sweats.  Admission Physical Exam and Labs: Physical Exam:  Blood pressure 130/84, pulse  96, temperature 97.6 F (36.4 C), temperature source Oral, resp. rate 15, SpO2 95.00%.  General appearance: alert, cooperative, no distress and slowed mentation  Head: Normocephalic, without obvious abnormality, atraumatic  Eyes: conjunctivae/corneas clear. PERRL, EOM's intact. Fundi benign.  Ears: normal TM's and external ear canals both ears  Nose: Nares normal. Septum midline. Mucosa normal. No drainage or sinus tenderness.  Throat: lips, mucosa, and tongue normal; teeth and gums normal  Neck: no adenopathy, no carotid bruit, no JVD, supple, symmetrical, trachea midline and thyroid not enlarged, symmetric, no tenderness/mass/nodules  Back: symmetric, no curvature. ROM normal. No CVA tenderness.  Lungs: rales RLL  Heart: regular rate and rhythm, S1, S2 normal, no murmur, click, rub or gallop  Abdomen: soft, non-tender; bowel sounds normal; no masses, no organomegaly  Extremities: edema some edema B/L LE and varicose veins noted; notable R arm, R leg weakness  Pulses: 2+ and symmetric  Skin: Skin color, texture, turgor normal. No rashes or lesions; notable R arm/R leg decreased sensation to touch  Neurologic: Mental status: Alert, oriented, thought content appropriate  Cranial nerves:  I: smell  Not tested   II: visual acuity  OS: blurry OD: blurry   II: visual fields  Full to confrontation   II: pupils  Equal, round, reactive to light   III,VII: ptosis  None   III,IV,VI: extraocular muscles  Full ROM   V: mastication  Normal   V: facial light touch sensation  Normal     V,VII: corneal reflex  Present   VII: facial muscle function - upper  R 3/5, L 5/5   VII: facial muscle function - lower  R 3/5, L 5/5   VIII: hearing  Not tested   IX: soft palate elevation  Normal   IX,X: gag reflex  Not tested   XI: trapezius strength  R 3/5, L 5/5   XI: sternocleidomastoid strength  5/5   XI: neck flexion strength  5/5   XII: tongue strength  Normal   Lab results:  Basic Metabolic Panel:   Basename  02/28/12 1207  02/28/12 1158   NA  139  136   K  4.0  3.4*   CL  100  98   CO2  --  24   GLUCOSE  146*  142*   BUN  14  14   CREATININE  0.70  0.72   CALCIUM  --  9.3    Liver Function Tests:   Basename  02/28/12 1158   AST  29   ALT  23   ALKPHOS  120*   BILITOT  0.2*   PROT  7.5   ALBUMIN  3.9    CBC:   Basename  02/28/12 1207  02/28/12 1158   WBC  --  5.4   NEUTROABS  --  2.6   HGB  15.0  12.9   HCT  44.0  41.1   MCV  --  86.0   PLT  --  312    Cardiac Enzymes:   Basename  02/28/12 1158   CKTOTAL  120   CKMB  1.7   TROPONINI  <0.30    CBG:   Basename  02/28/12 1226   GLUCAP  132*   Coagulation:   Basename  02/28/12 1158   LABPROT  12.8   INR  0.94     Hospital Course by problem list:  Neurologic deficits /TIA - Without intervention, Ms. Cortez returned to her baseline neurologic status by hospital day 2. MRI  was negative for acute stroke. As such, her deficits were likely was secondary to TIA versus progression of her encephalomalacia from prior stroke. Carotid Dopplers showed no stenosis and 2-D showed no cardiac source of emboli. She was discharged on clopidogrel 75 mg daily only, as data has not proven a benefit for dual platelet inhibition in stroke prophylaxis.   Diabetes mellitus2 - Ms. Cortez was found to have a hemoglobin A1c of 7.2 giving her new diagnosis of diabetes. We provided diabetic teaching and she was discharged with a prescription for metformin, glucometer, lancets and glucose meter strips. She will need followup  with her PCP regarding further management.    Hyperlipidemia - LDL calculated at 97 which is above her goal with new diagnosis of diabetes in someone with a history of stroke.  The patient was discharged on rosuvastatin   Depression - Mood was stable, we continued venlafaxine 75 mg twice a day   Diarrhea - The patient stated that she had diarrhea daily for many months. This was likely irritable bowel syndrome as the patient stated she was slightly constipated yesterday but has baseline diarrhea. Her diarrhea was unlikely to be C. difficile as she did not have enough bowel movements daily and only has diarrhea when she eats. Malabsorption was unlikely with normal vitamin D level. TSH and ferritin were normal. She had no diarrhea while admitted and tolerated initial doses of metformin well. Transglutaminase antibody sent and is pending.   History of seizures - No seizures occurred during her hospitalization. She was maintained on her home dose of anti-seizure medicine.   Discharge Vitals:  BP 158/90  Pulse 84  Temp(Src) 98.3 F (36.8 C) (Oral)  Resp 22  Ht 5\' 6"  (1.676 m)  Wt 230 lb 4.8 oz (104.463 kg)  BMI 37.17 kg/m2  SpO2 95%  Discharge Labs:   Time spent in discharge (includes decision making & examination of pt): greater than 35 minutes Lab Results  Component Value Date   HGBA1C 7.2* 02/29/2012   Lab Results  Component Value Date   CREATININE 0.75 03/01/2012   BUN 10 03/01/2012   NA 137 03/01/2012   K 4.1 03/01/2012   CL 101 03/01/2012   CO2 25 03/01/2012   Lab Results  Component Value Date   HGBA1C 7.2* 02/29/2012   Lab Results  Component Value Date   WBC 5.5 02/29/2012   HGB 13.5 02/29/2012   HCT 41.4 02/29/2012   MCV 85.9 02/29/2012   PLT 320 02/29/2012    Signed: Maryl Blalock 03/06/2012, 11:20 AM

## 2012-04-14 ENCOUNTER — Ambulatory Visit: Payer: Medicare Other | Attending: Neurology | Admitting: Rehabilitative and Restorative Service Providers"

## 2012-04-14 ENCOUNTER — Ambulatory Visit: Payer: Medicare Other | Admitting: Occupational Therapy

## 2012-04-14 DIAGNOSIS — Z5189 Encounter for other specified aftercare: Secondary | ICD-10-CM | POA: Insufficient documentation

## 2012-04-14 DIAGNOSIS — M6281 Muscle weakness (generalized): Secondary | ICD-10-CM | POA: Insufficient documentation

## 2012-04-14 DIAGNOSIS — M25619 Stiffness of unspecified shoulder, not elsewhere classified: Secondary | ICD-10-CM | POA: Insufficient documentation

## 2012-04-14 DIAGNOSIS — I69998 Other sequelae following unspecified cerebrovascular disease: Secondary | ICD-10-CM | POA: Insufficient documentation

## 2012-04-14 DIAGNOSIS — R5381 Other malaise: Secondary | ICD-10-CM | POA: Insufficient documentation

## 2012-04-14 DIAGNOSIS — R269 Unspecified abnormalities of gait and mobility: Secondary | ICD-10-CM | POA: Insufficient documentation

## 2012-04-19 ENCOUNTER — Ambulatory Visit: Payer: Medicare Other | Admitting: Rehabilitative and Restorative Service Providers"

## 2012-04-29 ENCOUNTER — Ambulatory Visit: Payer: Medicare Other | Admitting: Physical Therapy

## 2012-04-30 ENCOUNTER — Ambulatory Visit: Payer: Medicare Other | Admitting: Rehabilitative and Restorative Service Providers"

## 2012-05-04 ENCOUNTER — Ambulatory Visit: Payer: Medicare Other | Admitting: Rehabilitative and Restorative Service Providers"

## 2012-05-07 ENCOUNTER — Ambulatory Visit: Payer: Medicare Other | Attending: Neurology | Admitting: Rehabilitative and Restorative Service Providers"

## 2012-05-07 DIAGNOSIS — R269 Unspecified abnormalities of gait and mobility: Secondary | ICD-10-CM | POA: Insufficient documentation

## 2012-05-07 DIAGNOSIS — M6281 Muscle weakness (generalized): Secondary | ICD-10-CM | POA: Insufficient documentation

## 2012-05-07 DIAGNOSIS — M25619 Stiffness of unspecified shoulder, not elsewhere classified: Secondary | ICD-10-CM | POA: Insufficient documentation

## 2012-05-07 DIAGNOSIS — R5381 Other malaise: Secondary | ICD-10-CM | POA: Insufficient documentation

## 2012-05-07 DIAGNOSIS — Z5189 Encounter for other specified aftercare: Secondary | ICD-10-CM | POA: Insufficient documentation

## 2012-05-07 DIAGNOSIS — I69998 Other sequelae following unspecified cerebrovascular disease: Secondary | ICD-10-CM | POA: Insufficient documentation

## 2012-05-10 ENCOUNTER — Ambulatory Visit: Payer: Medicare Other | Admitting: Occupational Therapy

## 2012-05-10 ENCOUNTER — Ambulatory Visit: Payer: Medicare Other | Admitting: Physical Therapy

## 2012-05-13 ENCOUNTER — Ambulatory Visit: Payer: Medicare Other | Admitting: Occupational Therapy

## 2012-05-13 ENCOUNTER — Ambulatory Visit: Payer: Medicare Other | Admitting: Rehabilitative and Restorative Service Providers"

## 2012-05-17 ENCOUNTER — Ambulatory Visit: Payer: Medicare Other | Admitting: Rehabilitative and Restorative Service Providers"

## 2012-05-17 ENCOUNTER — Ambulatory Visit: Payer: Medicare Other | Admitting: Occupational Therapy

## 2012-05-19 ENCOUNTER — Ambulatory Visit: Payer: Medicare Other | Admitting: Rehabilitative and Restorative Service Providers"

## 2012-05-19 ENCOUNTER — Ambulatory Visit: Payer: Medicare Other | Admitting: Occupational Therapy

## 2012-06-02 ENCOUNTER — Ambulatory Visit: Payer: Medicare Other | Admitting: Rehabilitative and Restorative Service Providers"

## 2012-06-04 ENCOUNTER — Ambulatory Visit: Payer: Medicare Other | Admitting: Rehabilitative and Restorative Service Providers"

## 2012-06-09 ENCOUNTER — Ambulatory Visit: Payer: Medicare Other | Admitting: Rehabilitative and Restorative Service Providers"

## 2012-06-09 ENCOUNTER — Encounter: Payer: Medicare Other | Admitting: Occupational Therapy

## 2012-06-11 ENCOUNTER — Encounter: Payer: Medicare Other | Admitting: Occupational Therapy

## 2012-06-11 ENCOUNTER — Ambulatory Visit: Payer: Medicare Other | Admitting: Rehabilitative and Restorative Service Providers"

## 2012-06-14 ENCOUNTER — Encounter: Payer: Medicare Other | Admitting: Occupational Therapy

## 2012-06-16 ENCOUNTER — Encounter: Payer: Medicare Other | Admitting: Occupational Therapy

## 2015-03-21 ENCOUNTER — Observation Stay (HOSPITAL_COMMUNITY)
Admission: EM | Admit: 2015-03-21 | Discharge: 2015-03-23 | Disposition: A | Discharge status: Home / Self Care | Payer: Medicare HMO | Attending: Family Medicine | Admitting: Family Medicine

## 2015-03-21 ENCOUNTER — Emergency Department (HOSPITAL_COMMUNITY): Payer: Medicare HMO

## 2015-03-21 ENCOUNTER — Encounter (HOSPITAL_COMMUNITY): Payer: Self-pay | Admitting: Emergency Medicine

## 2015-03-21 DIAGNOSIS — I69328 Other speech and language deficits following cerebral infarction: Secondary | ICD-10-CM | POA: Insufficient documentation

## 2015-03-21 DIAGNOSIS — E119 Type 2 diabetes mellitus without complications: Secondary | ICD-10-CM | POA: Diagnosis not present

## 2015-03-21 DIAGNOSIS — Z86718 Personal history of other venous thrombosis and embolism: Secondary | ICD-10-CM | POA: Insufficient documentation

## 2015-03-21 DIAGNOSIS — F329 Major depressive disorder, single episode, unspecified: Secondary | ICD-10-CM | POA: Diagnosis not present

## 2015-03-21 DIAGNOSIS — I69392 Facial weakness following cerebral infarction: Secondary | ICD-10-CM | POA: Diagnosis not present

## 2015-03-21 DIAGNOSIS — I6521 Occlusion and stenosis of right carotid artery: Secondary | ICD-10-CM | POA: Diagnosis not present

## 2015-03-21 DIAGNOSIS — Z66 Do not resuscitate: Secondary | ICD-10-CM | POA: Diagnosis not present

## 2015-03-21 DIAGNOSIS — G40909 Epilepsy, unspecified, not intractable, without status epilepticus: Secondary | ICD-10-CM | POA: Insufficient documentation

## 2015-03-21 DIAGNOSIS — G629 Polyneuropathy, unspecified: Secondary | ICD-10-CM | POA: Insufficient documentation

## 2015-03-21 DIAGNOSIS — Z8673 Personal history of transient ischemic attack (TIA), and cerebral infarction without residual deficits: Secondary | ICD-10-CM | POA: Diagnosis not present

## 2015-03-21 DIAGNOSIS — E785 Hyperlipidemia, unspecified: Secondary | ICD-10-CM | POA: Diagnosis not present

## 2015-03-21 DIAGNOSIS — R29810 Facial weakness: Secondary | ICD-10-CM

## 2015-03-21 DIAGNOSIS — Z91041 Radiographic dye allergy status: Secondary | ICD-10-CM | POA: Insufficient documentation

## 2015-03-21 DIAGNOSIS — R531 Weakness: Secondary | ICD-10-CM

## 2015-03-21 DIAGNOSIS — F419 Anxiety disorder, unspecified: Secondary | ICD-10-CM | POA: Insufficient documentation

## 2015-03-21 DIAGNOSIS — R05 Cough: Secondary | ICD-10-CM

## 2015-03-21 DIAGNOSIS — I639 Cerebral infarction, unspecified: Secondary | ICD-10-CM

## 2015-03-21 DIAGNOSIS — R600 Localized edema: Secondary | ICD-10-CM

## 2015-03-21 DIAGNOSIS — I69354 Hemiplegia and hemiparesis following cerebral infarction affecting left non-dominant side: Principal | ICD-10-CM | POA: Insufficient documentation

## 2015-03-21 DIAGNOSIS — Z87891 Personal history of nicotine dependence: Secondary | ICD-10-CM | POA: Insufficient documentation

## 2015-03-21 DIAGNOSIS — R29898 Other symptoms and signs involving the musculoskeletal system: Secondary | ICD-10-CM | POA: Insufficient documentation

## 2015-03-21 DIAGNOSIS — R4781 Slurred speech: Secondary | ICD-10-CM | POA: Diagnosis present

## 2015-03-21 DIAGNOSIS — Z79899 Other long term (current) drug therapy: Secondary | ICD-10-CM | POA: Diagnosis not present

## 2015-03-21 DIAGNOSIS — I69322 Dysarthria following cerebral infarction: Secondary | ICD-10-CM | POA: Diagnosis not present

## 2015-03-21 DIAGNOSIS — R059 Cough, unspecified: Secondary | ICD-10-CM

## 2015-03-21 LAB — COMPREHENSIVE METABOLIC PANEL
ALK PHOS: 70 U/L (ref 38–126)
ALT: 20 U/L (ref 14–54)
AST: 20 U/L (ref 15–41)
Albumin: 3.6 g/dL (ref 3.5–5.0)
Anion gap: 7 (ref 5–15)
BUN: 9 mg/dL (ref 6–20)
CALCIUM: 8.9 mg/dL (ref 8.9–10.3)
CO2: 25 mmol/L (ref 22–32)
Chloride: 108 mmol/L (ref 101–111)
Creatinine, Ser: 0.87 mg/dL (ref 0.44–1.00)
GLUCOSE: 126 mg/dL — AB (ref 65–99)
Potassium: 3.9 mmol/L (ref 3.5–5.1)
SODIUM: 140 mmol/L (ref 135–145)
Total Bilirubin: 0.2 mg/dL — ABNORMAL LOW (ref 0.3–1.2)
Total Protein: 6.3 g/dL — ABNORMAL LOW (ref 6.5–8.1)

## 2015-03-21 LAB — URINALYSIS, ROUTINE W REFLEX MICROSCOPIC
BILIRUBIN URINE: NEGATIVE
Glucose, UA: NEGATIVE mg/dL
Hgb urine dipstick: NEGATIVE
KETONES UR: NEGATIVE mg/dL
NITRITE: NEGATIVE
PH: 6.5 (ref 5.0–8.0)
PROTEIN: NEGATIVE mg/dL
Specific Gravity, Urine: 1.017 (ref 1.005–1.030)
Urobilinogen, UA: 1 mg/dL (ref 0.0–1.0)

## 2015-03-21 LAB — CBC
HEMATOCRIT: 38.3 % (ref 36.0–46.0)
HEMOGLOBIN: 12 g/dL (ref 12.0–15.0)
MCH: 25.8 pg — ABNORMAL LOW (ref 26.0–34.0)
MCHC: 31.3 g/dL (ref 30.0–36.0)
MCV: 82.4 fL (ref 78.0–100.0)
Platelets: 323 10*3/uL (ref 150–400)
RBC: 4.65 MIL/uL (ref 3.87–5.11)
RDW: 14.5 % (ref 11.5–15.5)
WBC: 6.2 10*3/uL (ref 4.0–10.5)

## 2015-03-21 LAB — CBG MONITORING, ED: Glucose-Capillary: 105 mg/dL — ABNORMAL HIGH (ref 65–99)

## 2015-03-21 LAB — DIFFERENTIAL
BASOS ABS: 0 10*3/uL (ref 0.0–0.1)
BASOS PCT: 1 % (ref 0–1)
EOS ABS: 0.4 10*3/uL (ref 0.0–0.7)
EOS PCT: 6 % — AB (ref 0–5)
LYMPHS PCT: 32 % (ref 12–46)
Lymphs Abs: 2 10*3/uL (ref 0.7–4.0)
MONO ABS: 0.6 10*3/uL (ref 0.1–1.0)
Monocytes Relative: 9 % (ref 3–12)
Neutro Abs: 3.2 10*3/uL (ref 1.7–7.7)
Neutrophils Relative %: 52 % (ref 43–77)

## 2015-03-21 LAB — APTT: aPTT: 27 seconds (ref 24–37)

## 2015-03-21 LAB — I-STAT TROPONIN, ED: Troponin i, poc: 0 ng/mL (ref 0.00–0.08)

## 2015-03-21 LAB — PROTIME-INR
INR: 0.96 (ref 0.00–1.49)
Prothrombin Time: 13 seconds (ref 11.6–15.2)

## 2015-03-21 LAB — ETHANOL

## 2015-03-21 LAB — URINE MICROSCOPIC-ADD ON

## 2015-03-21 MED ORDER — STROKE: EARLY STAGES OF RECOVERY BOOK
Freq: Once | Status: AC
  Administered 2015-03-21: 23:00:00

## 2015-03-21 MED ORDER — LORAZEPAM 2 MG/ML IJ SOLN
1.0000 mg | Freq: Once | INTRAMUSCULAR | Status: AC
  Administered 2015-03-21: 1 mg via INTRAVENOUS
  Filled 2015-03-21: qty 1

## 2015-03-21 MED ORDER — ASPIRIN EC 325 MG PO TBEC
325.0000 mg | DELAYED_RELEASE_TABLET | Freq: Every day | ORAL | Status: DC
  Administered 2015-03-22 – 2015-03-23 (×2): 325 mg via ORAL
  Filled 2015-03-21 (×2): qty 1

## 2015-03-21 MED ORDER — PANTOPRAZOLE SODIUM 40 MG PO TBEC
40.0000 mg | DELAYED_RELEASE_TABLET | Freq: Every day | ORAL | Status: DC
  Administered 2015-03-22 – 2015-03-23 (×2): 40 mg via ORAL
  Filled 2015-03-21 (×2): qty 1

## 2015-03-21 MED ORDER — RISPERIDONE 0.5 MG PO TABS: 0.2500 mg | ORAL_TABLET | Freq: Every day | ORAL | Status: DC

## 2015-03-21 MED ORDER — CYCLOBENZAPRINE HCL 10 MG PO TABS: 10.0000 mg | ORAL_TABLET | Freq: Three times a day (TID) | ORAL | Status: DC | PRN

## 2015-03-21 MED ORDER — ZONISAMIDE 100 MG PO CAPS
200.0000 mg | ORAL_CAPSULE | Freq: Every day | ORAL | Status: DC
  Administered 2015-03-22: 200 mg via ORAL
  Filled 2015-03-21 (×3): qty 2

## 2015-03-21 MED ORDER — CLONAZEPAM 0.5 MG PO TABS: 0.5000 mg | ORAL_TABLET | Freq: Once | ORAL | Status: DC

## 2015-03-21 MED ORDER — ROSUVASTATIN CALCIUM 40 MG PO TABS
40.0000 mg | ORAL_TABLET | Freq: Every evening | ORAL | Status: DC
  Administered 2015-03-22: 40 mg via ORAL
  Filled 2015-03-21 (×3): qty 1

## 2015-03-21 MED ORDER — GABAPENTIN 300 MG PO CAPS
300.0000 mg | ORAL_CAPSULE | Freq: Three times a day (TID) | ORAL | Status: DC
  Administered 2015-03-22 – 2015-03-23 (×3): 300 mg via ORAL
  Filled 2015-03-21 (×4): qty 1

## 2015-03-21 MED ORDER — NAPROXEN 250 MG PO TABS: 250.0000 mg | ORAL_TABLET | Freq: Two times a day (BID) | ORAL | Status: DC | PRN

## 2015-03-21 MED ORDER — SENNOSIDES-DOCUSATE SODIUM 8.6-50 MG PO TABS: 1.0000 | ORAL_TABLET | Freq: Every evening | ORAL | Status: DC | PRN

## 2015-03-21 MED ORDER — MONTELUKAST SODIUM 10 MG PO TABS
10.0000 mg | ORAL_TABLET | Freq: Every morning | ORAL | Status: DC
  Administered 2015-03-22 – 2015-03-23 (×2): 10 mg via ORAL
  Filled 2015-03-21 (×2): qty 1

## 2015-03-21 MED ORDER — SODIUM CHLORIDE 0.9 % IV SOLN
500.0000 mg | Freq: Once | INTRAVENOUS | Status: AC
  Administered 2015-03-21: 500 mg via INTRAVENOUS
  Filled 2015-03-21: qty 5

## 2015-03-21 MED ORDER — DULOXETINE HCL 60 MG PO CPEP
60.0000 mg | ORAL_CAPSULE | Freq: Every day | ORAL | Status: DC
  Administered 2015-03-22: 60 mg via ORAL
  Filled 2015-03-21: qty 1

## 2015-03-21 MED ORDER — RISPERIDONE 0.5 MG PO TBDP
0.2500 mg | ORAL_TABLET | Freq: Every day | ORAL | Status: DC
  Administered 2015-03-21 – 2015-03-22 (×2): 0.25 mg via ORAL
  Filled 2015-03-21 (×3): qty 0.5

## 2015-03-21 MED ORDER — CLOPIDOGREL BISULFATE 75 MG PO TABS
75.0000 mg | ORAL_TABLET | Freq: Every day | ORAL | Status: DC
  Administered 2015-03-22 – 2015-03-23 (×2): 75 mg via ORAL
  Filled 2015-03-21 (×2): qty 1

## 2015-03-21 NOTE — H&P (Signed)
[facility] Admission History and Physical Service Pager: [phone]  Patient name: Andrea Cortez Medical record number: [id number] Date of birth: 08/29/1956 Age: 60 y.o. Gender: female  Primary Care Provider: No primary care provider on file. Consultants: neuro Code Status: DNR (discussed on admission)  Chief Complaint: L sided weakness  Assessment and Plan: Andrea Cortez is a 60 y.o. female presenting with L sided weakness . PMH is significant for h/o CVA, TIA, T2DM, seizure  L sided weakness: CVA vs TIA CT head with no acute infarcts/hemorrhage.  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  H/o CVA in 2004, 2010 and 2 weeks ago with residual deficits of right sided weakness and slurred speech. -Admit to telemetry under Dr Mingo Amber -VS per floor protocol -Will order A1c, Lipid, TSH, Carotid doppler, ECHO if needed.  MRI obtained there as well.  Daughter to bring records from [facility] late tomorrow morning -c/s Neuro in am -SCDs -c/s PT/OT/SLP -Continue Plavix, Crestor, ASA  LE edema: R>L.  New.  H/o DVTs.  Negative homan's -RLE venous doppler ordered  T2DM: on Metformin at home -Will need A1c if not obtained 2 weeks ago. -Hold metformin -SSI while hospitalized -CBG monitoring  Seizure d/o: 2/2 stroke sustained in 2004. -Continue home Zonegran  Anxiety/Depression: H/o of severe/prolonged agitation during prior hospitalizations -Continue home Cymbalta, Risperdal -give ativan x1 now for agitation, per daughter she takes klonopin chronically but we do not have a record of this  Peripheral neuropathy:  -Continue home neurontin, flexeril, cymbalta  FEN/GI: NPO until speech eval, SLIV Prophylaxis: SCDs  Disposition: Admit to telemetry.  Discharge pending neuro eval.  History of Present Illness: Andrea Cortez is a 59 y.o. female presenting with slurred speech, instability. Patient reports that nurse came to her  house yesterday and today and she had blood pressures in the 130-865 range systolic.  She reports that she had a stroke 2 weeks ago and was hospitalized at [facility].  Patient started feeling weak Monday, thought it was just a bad day.  She reports that she was able to laundry so thought she was ok.  Yesterday, she reports that her legs were even weaker, mostly on the right but this is her previous deficit.  She reports left LE weakness is new. Arm strength feels normal.    Hospitalized at [facility] 2 weeks, MRI at that time unable to visualize new stroke given multiple previous strokes  Daughter notes significant cognitive differences and shakiness when walking starting today. Speech is also more slurred than usual as well.   Patient reports new leg swelling that is bilateral, h/o dvt, h/o vein stripping.    Review Of Systems: Per HPI with the following additions: none Otherwise 12 point review of systems was performed and was unremarkable.  Patient Active Problem List   Diagnosis Date Noted  . TIA (transient ischemic attack) 02/29/2012  . Diabetes mellitus type 2, uncontrolled 02/29/2012  . Hyperlipidemia LDL goal < 70   . History of tobacco abuse   . Stroke   . Varicose vein   . Depression   . History of seizures    Past Medical History: Past Medical History  Diagnosis Date  . Stroke 2004 x 2, 2010 x 1    left slightly hemorrhagic left temporal and parietal infarct, new acute right medial thalamus, and left anterior pons --> had extended rehabilitation course // Continues to have residual right UE weakness  . Hyperlipidemia   . Depression  2/2 stroke in 2004  . Seizure 2004 after stroke    developed 2/2 stroke in 2004  . Tobacco abuse 1.5 PPD x 38 years    smoking since 6th grade; quit 2004  . Varicose vein     B/L LE   Past Surgical History: Past Surgical History  Procedure Laterality Date  . Partial hysterectomy  1998/1999    uterus removed 2/2 cervical cancer   . Tonsillectomy      as a child   Social History: History  Substance Use Topics  . Smoking status: Former Smoker -- 1.50 packs/day for 38 years    Types: Cigarettes    Quit date: 01/24/2012  . Smokeless tobacco: Never Used     Comment: started smoking in 6th grade  . Alcohol Use: No   Additional social history: no tobacco, no drug, no ETOH, lives with daughter.  Normally able to perform most ADLs  Please also refer to relevant sections of EMR.  Family History: Family History  Problem Relation Age of Onset  . Cancer Mother     lung cancer, died at 72  . Lung disease Father     1 lung removed, death (u/k cause)  . Heart attack Brother     massive MI causing death  . HIV Brother     died from AIDS  . Emphysema Brother     alive  . Other Daughter     hypochondriac   Allergies and Medications: Allergies  Allergen Reactions  . Ivp Dye [Iodinated Diagnostic Agents] Shortness Of Breath  . Niacin And Related Other (See Comments)    Must take flush-free Niacin  . Iron Other (See Comments)    Unknown    No current facility-administered medications on file prior to encounter.   Current Outpatient Prescriptions on File Prior to Encounter  Medication Sig Dispense Refill  . metFORMIN (GLUCOPHAGE) 500 MG tablet Take 1 tablet (500 mg total) by mouth daily with breakfast. FOR DIABETES. (Patient taking differently: Take 500 mg by mouth 2 (two) times daily with a meal. FOR DIABETES.) 30 tablet 0  . pantoprazole (PROTONIX) 40 MG tablet Take 1 tablet (40 mg total) by mouth daily at 12 noon. FOR REFLUX. 30 tablet 0  . rosuvastatin (CRESTOR) 40 MG tablet Take 1 tablet (40 mg total) by mouth daily. FOR CHOLESTEROL. (Patient taking differently: Take 40 mg by mouth every evening. FOR CHOLESTEROL.) 30 tablet 0  . zonisamide (ZONEGRAN) 100 MG capsule Take 200 mg by mouth at bedtime.       Objective: BP 123/63 mmHg  Pulse 67  Temp(Src) 99.1 F (37.3 C) (Oral)  Resp 18  SpO2  96% Exam: General: awake, alert, NAD, L sided facial droop Eyes: EOMI, PERRL ENTM: o/p clear, MMM Cardiovascular: RRR, no murmurs, +2DP Respiratory: CTAB, no increased WOB, no wheeze Abdomen: soft, NT/ND MSK: 4/5 RLE/RUE, 5/5 LLE/LUE, hand grip decreased on R Skin: no rashes Neuro: no L sided pronator drift (right side not able to be assessed) or LE cerebellar deficits, hearing decreased R compared to L, L sided facial droop, decreased sensation to R, no dysmetria on L (r not assessed), follows commands, speech slurred. Psych: mood stable, patient pleasant  Labs and Imaging: CBC BMET   Recent Labs Lab 03/21/15 1536  WBC 6.2  HGB 12.0  HCT 38.3  PLT 323    Recent Labs Lab 03/21/15 1536  NA 140  K 3.9  CL 108  CO2 25  BUN 9  CREATININE 0.87  GLUCOSE 126*  CALCIUM 8.9     Dg Chest 2 View  03/21/2015   CLINICAL DATA:  59 year old female with 3 day history of cough  EXAM: CHEST  2 VIEW  COMPARISON:  Prior chest x-ray 03/06/2015  FINDINGS: Cardiac and mediastinal contours remain within normal limits. Trace atherosclerotic calcification present within the transverse aorta. Stable background bronchitic change and diffuse mild interstitial prominence. No new focal airspace consolidation, pleural effusion, pneumothorax or pulmonary edema. No acute osseous abnormality.  IMPRESSION: Stable chest x-ray without evidence of acute cardiopulmonary process.   Electronically Signed   By: Jacqulynn Cadet M.D.   On: 03/21/2015 19:33   Ct Head (brain) Wo Contrast  03/21/2015   CLINICAL DATA:  Stroke  EXAM: CT HEAD WITHOUT CONTRAST  TECHNIQUE: Contiguous axial images were obtained from the base of the skull through the vertex without intravenous contrast.  COMPARISON:  CT head 03/05/2015  FINDINGS: Chronic left MCA infarct is unchanged. Chronic infarct right medial thalamus unchanged  Negative for acute infarct.  Negative for hemorrhage or mass.  Ventricle size is normal.  No shift of the midline  structures.  Calvarium intact.  IMPRESSION: Chronic ischemic changes left MCA territory and right thalamus. No acute abnormality.   Electronically Signed   By: Franchot Gallo M.D.   On: 03/21/2015 17:01   Janora Norlander, DO 03/21/2015, 8:09 PM PGY-1, [place] Intern pager: [phone], text pages welcome  FPTS Upper-Level Resident Addendum  I have independently interviewed and examined the patient. I have discussed the above with the original author and agree with their documentation. My edits for correction/addition/clarification are in pink. Please see also any attending notes.   Frazier Richards, MD PGY-2, [place] Service pager: [phone] (text pages welcome through [facility])

## 2015-03-21 NOTE — ED Notes (Signed)
Pt with left sided weakness, dizziness, trouble walking and slurred speech with increasing facial droop x 2 days; pt with hx of multiple strokes in past with right sided deficits

## 2015-03-21 NOTE — Progress Notes (Signed)
Pt. Arrived from ED via tech and daughter reportin gno pain with VSS. Will continu8e to monitor. Joaquin Bend E, [place] 03/21/2015 2132

## 2015-03-21 NOTE — ED Provider Notes (Signed)
CSN: [id number]     Arrival date & time 03/21/15  1441 History   First MD Initiated Contact with Patient 03/21/15 1748     Chief Complaint  Patient presents with  . Stroke Symptoms     (Consider location/radiation/quality/duration/timing/severity/associated sxs/prior Treatment) The history is provided by the patient, a relative and medical records. No language interpreter was used.     Andrea Cortez is a 59 y.o. female  with a hx of CVA (residual RUE weakness) presents to the Emergency Department complaining of waxing and waning confusion onset 11am this morning after waking.  Pt's daughter saw her last 2 days ago and she was normal on Monday.  Pt's daughter did not see her yesterday due to work.  Pt's home health nurse reports some HTN (SBP 150) on Tuesday.  Patient reports increase production of the cough since Monday, but patient and daughter deny fevers.  Pt answers some questions with slowed and slurred speech.  Pt's daughter reports she has had multiple strokes in the past with residual right-sided deficits.  Associated symptoms include increasing right-sided facial droop and left-sided weakness.  His daughter reports that she has never had left-sided weakness and now has gait instability due to previous right-sided weakness and new left-sided weakness.  Patient and her daughter report that they do not want intervention for this CVA.    RJJ:[id number] of [place].    Past Medical History  Diagnosis Date  . Stroke 2004 x 2, 2010 x 1    left slightly hemorrhagic left temporal and parietal infarct, new acute right medial thalamus, and left anterior pons --> had extended rehabilitation course // Continues to have residual right UE weakness  . Hyperlipidemia   . Depression     2/2 stroke in 2004  . Seizure 2004 after stroke    developed 2/2 stroke in 2004  . Tobacco abuse 1.5 PPD x 38 years    smoking since 6th grade; quit 2004  . Varicose vein     B/L LE   Past Surgical History   Procedure Laterality Date  . Partial hysterectomy  1998/1999    uterus removed 2/2 cervical cancer  . Tonsillectomy      as a child   Family History  Problem Relation Age of Onset  . Cancer Mother     lung cancer, died at 1  . Lung disease Father     1 lung removed, death (u/k cause)  . Heart attack Brother     massive MI causing death  . HIV Brother     died from AIDS  . Emphysema Brother     alive  . Other Daughter     hypochondriac   History  Substance Use Topics  . Smoking status: Former Smoker -- 1.50 packs/day for 38 years    Types: Cigarettes    Quit date: 01/24/2012  . Smokeless tobacco: Never Used     Comment: started smoking in 6th grade  . Alcohol Use: No   OB History    No data available     Review of Systems  Constitutional: Negative for fever, diaphoresis, appetite change, fatigue and unexpected weight change.  HENT: Negative for mouth sores.   Eyes: Negative for visual disturbance.  Respiratory: Negative for cough, chest tightness, shortness of breath and wheezing.   Cardiovascular: Negative for chest pain.  Gastrointestinal: Negative for nausea, vomiting, abdominal pain, diarrhea and constipation.  Endocrine: Negative for polydipsia, polyphagia and polyuria.  Genitourinary: Negative for dysuria, urgency, frequency and  hematuria.  Musculoskeletal: Negative for back pain and neck stiffness.  Skin: Negative for rash.  Allergic/Immunologic: Negative for immunocompromised state.  Neurological: Positive for speech difficulty and weakness. Negative for syncope, light-headedness and headaches.  Hematological: Does not bruise/bleed easily.  Psychiatric/Behavioral: Negative for sleep disturbance. The patient is not nervous/anxious.       Allergies  Ivp dye; Niacin and related; and Iron  Home Medications   Prior to Admission medications   Medication Sig Start Date End Date Taking? Authorizing Provider  aspirin 325 MG EC tablet Take 325 mg by mouth  daily.   Yes Historical Provider, MD  cyclobenzaprine (FLEXERIL) 10 MG tablet Take 10 mg by mouth 3 (three) times daily as needed for muscle spasms.   Yes Historical Provider, MD  DULoxetine (CYMBALTA) 60 MG capsule Take 60 mg by mouth daily.   Yes Historical Provider, MD  gabapentin (NEURONTIN) 300 MG capsule Take 300 mg by mouth 3 (three) times daily. 03/15/15  Yes Historical Provider, MD  metFORMIN (GLUCOPHAGE) 500 MG tablet Take 1 tablet (500 mg total) by mouth daily with breakfast. FOR DIABETES. Patient taking differently: Take 500 mg by mouth 2 (two) times daily with a meal. FOR DIABETES. 03/01/12 03/21/15 Yes Maitri S Kalia-Reynolds, DO  montelukast (SINGULAIR) 10 MG tablet Take 10 mg by mouth every morning. 03/11/15  Yes Historical Provider, MD  naproxen sodium (ANAPROX) 220 MG tablet Take 220 mg by mouth 2 (two) times daily as needed (for pain).   Yes Historical Provider, MD  pantoprazole (PROTONIX) 40 MG tablet Take 1 tablet (40 mg total) by mouth daily at 12 noon. FOR REFLUX. 03/01/12 03/21/15 Yes Maitri S Kalia-Reynolds, DO  risperiDONE (RISPERDAL) 0.25 MG tablet Take 0.25 mg by mouth at bedtime.   Yes Historical Provider, MD  rosuvastatin (CRESTOR) 40 MG tablet Take 1 tablet (40 mg total) by mouth daily. FOR CHOLESTEROL. Patient taking differently: Take 40 mg by mouth every evening. FOR CHOLESTEROL. 03/01/12  Yes Maitri S Kalia-Reynolds, DO  tiZANidine (ZANAFLEX) 4 MG tablet Take 4 mg by mouth every 6 (six) hours as needed for muscle spasms.   Yes Historical Provider, MD  zonisamide (ZONEGRAN) 100 MG capsule Take 200 mg by mouth at bedtime.    Yes Historical Provider, MD   BP 122/54 mmHg  Pulse 82  Temp(Src) 97.9 F (36.6 C) (Oral)  Resp 20  Ht 6\' 5"  (1.956 m)  Wt 236 lb 12.8 oz (107.412 kg)  BMI 28.07 kg/m2  SpO2 95% Physical Exam  Constitutional: She is oriented to person, place, and time. She appears well-developed and well-nourished. No distress.  Awake, alert, nontoxic appearance   HENT:  Head: Normocephalic and atraumatic.  Mouth/Throat: Oropharynx is clear and moist. No oropharyngeal exudate.  Eyes: Conjunctivae and EOM are normal. Pupils are equal, round, and reactive to light. No scleral icterus.  No horizontal, vertical or rotational nystagmus  Neck: Normal range of motion. Neck supple.  Full active and passive ROM without pain No midline or paraspinal tenderness No nuchal rigidity or meningeal signs  Cardiovascular: Normal rate, regular rhythm, normal heart sounds and intact distal pulses.   Pulmonary/Chest: Effort normal and breath sounds normal. No respiratory distress. She has no wheezes. She has no rales.  Equal chest expansion  Abdominal: Soft. Bowel sounds are normal. She exhibits no mass. There is no tenderness. There is no rebound and no guarding.  Musculoskeletal: Normal range of motion. She exhibits edema.  1+ nonpitting edema of the bilateral lower extremities  Lymphadenopathy:  She has no cervical adenopathy.  Neurological: She is alert and oriented to person, place, and time. She has normal reflexes. No cranial nerve deficit. She exhibits normal muscle tone. Coordination normal.  Mental Status:  Alert, oriented, thought content appropriate. Slurred speech, delayed response and slowed word formation; Able to follow 2 step commands without difficulty.  Cranial Nerves:  II:  pupils equal, round, reactive to light III,IV, VI: ptosis not present, extra-ocular motions intact bilaterally  V,VII: Sided facial droop VIII: hearing grossly normal bilaterally  IX,X: Uvula rises more on the left than on the right XI: Decreased right shoulder shrug Motor:  3/5 drinks in the right upper extremity, 4/5 in the right lower extremity, 4/5 in the left upper and left lower extremity Sensory: Pinprick and light touch normal in all extremities.  Cerebellar: normal finger-to-nose with left upper extremities; unable to complete with right upper extremity due to  previous deficit Gait: Gait testing deferred at this time due to reported significant instability CV: distal pulses palpable throughout   Skin: Skin is warm and dry. No rash noted. She is not diaphoretic.  Psychiatric: She has a normal mood and affect. Her behavior is normal. Judgment and thought content normal.  Nursing note and vitals reviewed.     ED Course  Procedures (including critical care time) Labs Review Labs Reviewed  CBC - Abnormal; Notable for the following:    MCH 25.8 (*)    All other components within normal limits  DIFFERENTIAL - Abnormal; Notable for the following:    Eosinophils Relative 6 (*)    All other components within normal limits  COMPREHENSIVE METABOLIC PANEL - Abnormal; Notable for the following:    Glucose, Bld 126 (*)    Total Protein 6.3 (*)    Total Bilirubin 0.2 (*)    All other components within normal limits  URINALYSIS, ROUTINE W REFLEX MICROSCOPIC (NOT AT [facility]) - Abnormal; Notable for the following:    Color, Urine AMBER (*)    Leukocytes, UA TRACE (*)    All other components within normal limits  CBG MONITORING, ED - Abnormal; Notable for the following:    Glucose-Capillary 105 (*)    All other components within normal limits  PROTIME-INR  APTT  URINE MICROSCOPIC-ADD ON  ETHANOL  URINE RAPID DRUG SCREEN, HOSP PERFORMED  I-STAT TROPOININ, ED    Imaging Review Dg Chest 2 View  03/21/2015   CLINICAL DATA:  59 year old female with 3 day history of cough  EXAM: CHEST  2 VIEW  COMPARISON:  Prior chest x-ray 03/06/2015  FINDINGS: Cardiac and mediastinal contours remain within normal limits. Trace atherosclerotic calcification present within the transverse aorta. Stable background bronchitic change and diffuse mild interstitial prominence. No new focal airspace consolidation, pleural effusion, pneumothorax or pulmonary edema. No acute osseous abnormality.  IMPRESSION: Stable chest x-ray without evidence of acute cardiopulmonary process.    Electronically Signed   By: Jacqulynn Cadet M.D.   On: 03/21/2015 19:33   Ct Head (brain) Wo Contrast  03/21/2015   CLINICAL DATA:  Stroke  EXAM: CT HEAD WITHOUT CONTRAST  TECHNIQUE: Contiguous axial images were obtained from the base of the skull through the vertex without intravenous contrast.  COMPARISON:  CT head 03/05/2015  FINDINGS: Chronic left MCA infarct is unchanged. Chronic infarct right medial thalamus unchanged  Negative for acute infarct.  Negative for hemorrhage or mass.  Ventricle size is normal.  No shift of the midline structures.  Calvarium intact.  IMPRESSION: Chronic ischemic changes left  MCA territory and right thalamus. No acute abnormality.   Electronically Signed   By: Franchot Gallo M.D.   On: 03/21/2015 17:01     EKG Interpretation   Date/Time:  Wednesday March 21 2015 14:47:01 EDT Ventricular Rate:  97 PR Interval:  162 QRS Duration: 108 QT Interval:  374 QTC Calculation: 474 R Axis:   -45 Text Interpretation:  Normal sinus rhythm Left anterior fascicular block  Cannot rule out Anterior infarct , age undetermined Abnormal ECG No  significant change since last tracing Confirmed by [place]  MD, [place]  [phone]) on 03/21/2015 9:07:25 PM        MDM   Final diagnoses:  Cough  Left-sided weakness  Facial droop  H/O: stroke    Andrea Cortez presents with slurred speech, new left sided weakness and worsening right sided facial droop.  No code stroke called due to LSW > 24 hours and desire for no intervention by family and patient.  Labs are reassuring.  Will obtain chest x-ray as patient complains of cough.  CT without acute change from previous.  On exam patient with some left-sided weakness however this is difficult to discern in comparison to her right side which is weak at baseline.  Gait not tested due to reported instability.   8:23 PM Discussed with Dr. Lajuana Ripple of Family Medicine who will admit.    BP 122/54 mmHg  Pulse 82  Temp(Src) 97.9 F (36.6  C) (Oral)  Resp 20  Ht 6\' 5"  (1.956 m)  Wt 236 lb 12.8 oz (107.412 kg)  BMI 28.07 kg/m2  SpO2 95%  The patient was discussed with and seen by Dr. Tawnya Crook who agrees with the treatment plan.   Jarrett Soho Izyan Ezzell, PA-C 03/22/15 [id number]  Ernestina Patches, MD 03/22/15 [phone]

## 2015-03-21 NOTE — Progress Notes (Signed)
Attempted report once, nurse will call back. Andrea Cortez, [place] 03/21/2015 8:54 PM

## 2015-03-21 NOTE — Progress Notes (Signed)
Received report from ED nurse [place]. Awaiting pt. Transfer [place] Silos, [place] Wainwright E, RN 03/21/2015 9:03 PM

## 2015-03-21 NOTE — ED Notes (Signed)
Patient transported to X-ray 

## 2015-03-22 ENCOUNTER — Observation Stay (HOSPITAL_COMMUNITY): Payer: Medicare HMO

## 2015-03-22 DIAGNOSIS — Z8673 Personal history of transient ischemic attack (TIA), and cerebral infarction without residual deficits: Secondary | ICD-10-CM | POA: Diagnosis not present

## 2015-03-22 DIAGNOSIS — R29810 Facial weakness: Secondary | ICD-10-CM | POA: Diagnosis not present

## 2015-03-22 DIAGNOSIS — R600 Localized edema: Secondary | ICD-10-CM

## 2015-03-22 DIAGNOSIS — R29898 Other symptoms and signs involving the musculoskeletal system: Secondary | ICD-10-CM | POA: Diagnosis not present

## 2015-03-22 DIAGNOSIS — R4781 Slurred speech: Secondary | ICD-10-CM

## 2015-03-22 DIAGNOSIS — M6289 Other specified disorders of muscle: Secondary | ICD-10-CM | POA: Diagnosis not present

## 2015-03-22 DIAGNOSIS — R531 Weakness: Secondary | ICD-10-CM | POA: Insufficient documentation

## 2015-03-22 LAB — GLUCOSE, CAPILLARY
GLUCOSE-CAPILLARY: 108 mg/dL — AB (ref 65–99)
GLUCOSE-CAPILLARY: 97 mg/dL (ref 65–99)
GLUCOSE-CAPILLARY: 134 mg/dL — AB (ref 65–99)
Glucose-Capillary: 145 mg/dL — ABNORMAL HIGH (ref 65–99)
Glucose-Capillary: 79 mg/dL (ref 65–99)
Glucose-Capillary: 105 mg/dL — ABNORMAL HIGH (ref 65–99)
Glucose-Capillary: 107 mg/dL — ABNORMAL HIGH (ref 65–99)

## 2015-03-22 LAB — RAPID URINE DRUG SCREEN, HOSP PERFORMED
AMPHETAMINES: NOT DETECTED
BARBITURATES: NOT DETECTED
Benzodiazepines: NOT DETECTED
COCAINE: NOT DETECTED
Opiates: NOT DETECTED
TETRAHYDROCANNABINOL: NOT DETECTED

## 2015-03-22 MED ORDER — LORAZEPAM 2 MG/ML IJ SOLN
1.0000 mg | Freq: Once | INTRAMUSCULAR | Status: AC
  Administered 2015-03-22: 1 mg via INTRAVENOUS
  Filled 2015-03-22: qty 1

## 2015-03-22 MED ORDER — INSULIN ASPART 100 UNIT/ML ~~LOC~~ SOLN: 0.0000 [IU] | Freq: Three times a day (TID) | SUBCUTANEOUS | Status: DC

## 2015-03-22 MED ORDER — LORAZEPAM 2 MG/ML IJ SOLN: 0.5000 mg | Freq: Once | INTRAMUSCULAR | Status: DC

## 2015-03-22 NOTE — Evaluation (Signed)
Clinical/Bedside Swallow Evaluation Patient Details  Name: Andrea Cortez MRN: [id number] Date of Birth: 27-Dec-1955  Today's Date: 03/22/2015 Time: SLP Start Time (ACUTE ONLY): 95 SLP Stop Time (ACUTE ONLY): 1125 SLP Time Calculation (min) (ACUTE ONLY): 14 min  Past Medical History:  Past Medical History  Diagnosis Date  . Stroke 2004 x 2, 2010 x 1    left slightly hemorrhagic left temporal and parietal infarct, new acute right medial thalamus, and left anterior pons --> had extended rehabilitation course // Continues to have residual right UE weakness  . Hyperlipidemia   . Depression     2/2 stroke in 2004  . Seizure 2004 after stroke    developed 2/2 stroke in 2004  . Tobacco abuse 1.5 PPD x 38 years    smoking since 6th grade; quit 2004  . Varicose vein     B/L LE   Past Surgical History:  Past Surgical History  Procedure Laterality Date  . Partial hysterectomy  1998/1999    uterus removed 2/2 cervical cancer  . Tonsillectomy      as a child   HPI:  Andrea Cortez is a 59 y.o. female presenting with L sided weakness and slurred speech. 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 Hosptialized 2 weeks ago at [facility] for CVA however MRI at that time unable to visualize new CVA.  MRI 6/16 without acute CVA. CXR negative.    Assessment / Plan / Recommendation Clinical Impression  Patient presents with a functional oropharyngeal swallow with what appears to be full airway protection and independent use of compensatory strategies including frequent lingual sweep and liquid wash to aid in clearance of mild right sided buccal residue. SLP will initiate a po diet and f/u briefly for tolerance given h/o mutliple CVAs with dysphagia and question of acute stroke.     Aspiration Risk  Mild    Diet Recommendation Age appropriate regular solids;Thin   Medication  Administration: Whole meds with liquid Compensations: Slow rate;Small sips/bites (lingual sweep to clear right buccal residue)    Other  Recommendations Oral Care Recommendations: Oral care BID      Frequency and Duration min 1 x/week  1 week   Pertinent Vitals/Pain n/a        Swallow Study    General Other Pertinent Information: Andrea Cortez is a 60 y.o. female presenting with L sided weakness and slurred speech. 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 Hosptialized 2 weeks ago at [facility] for CVA however MRI at that time unable to visualize new CVA.  MRI 6/16 without acute CVA. CXR negative.  Type of Study: Bedside swallow evaluation Previous Swallow Assessment: multiple MBSS complete in 2004, no results available Diet Prior to this Study: NPO Temperature Spikes Noted: No Respiratory Status: Room air History of Recent Intubation: No Behavior/Cognition: Alert;Cooperative (tearful, frustrated over NPO status) Oral Cavity - Dentition: Adequate natural dentition/normal for age Self-Feeding Abilities: Able to feed self Patient Positioning: Upright in chair/Tumbleform Baseline Vocal Quality: Normal Volitional Cough: Strong Volitional Swallow: Able to elicit    Oral/Motor/Sensory Function Overall Oral Motor/Sensory Function: Impaired Labial ROM: Reduced right Labial Symmetry: Within Functional Limits Labial Strength: Reduced Labial Sensation: Within Functional Limits Lingual ROM: Reduced right Lingual Symmetry: Within Functional Limits Lingual Strength: Reduced Lingual Sensation: Within Functional  Limits Facial ROM: Reduced right Facial Strength: Reduced Facial Sensation: Reduced Velum: Within Functional Limits Mandible: Within Functional Limits   Ice Chips Ice chips: Not tested   Thin Liquid Thin Liquid: Within functional limits Presentation: Cup;Self  Fed;Straw    Nectar Thick Nectar Thick Liquid: Not tested   Honey Thick Honey Thick Liquid: Not tested   Puree Puree: Within functional limits Presentation: Self Fed;Spoon   Solid   GO Functional Assessment Tool Used: skilled clinical judgement Functional Limitations: Swallowing Swallow Current Status (W4097): At least 1 percent but less than 20 percent impaired, limited or restricted Swallow Goal Status [phone]): At least 1 percent but less than 20 percent impaired, limited or restricted  Solid: Within functional limits Presentation: [place] MA, CCC-SLP [phone]  Andrea Cortez 03/22/2015,11:39 AM

## 2015-03-22 NOTE — Evaluation (Signed)
Physical Therapy Evaluation Patient Details Name: Andrea Cortez MRN: [id number] DOB: 1956/09/08 Today's Date: 03/22/2015   History of Present Illness   59 y.o. female presenting with L sided weakness and slurred speech. 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 Hosptialized 2 weeks ago at [facility] for CVA however MRI at that time unable to visualize new CVA. MRI 6/16 without acute CVA. CXR negative.  Clinical Impression  Pt indicates generally feeling weak, but appears to be mobilizing near baseline since last CVA ~2weeks ago.  Pt has good family support and had just finished HHPT and was supposed to start OPPT soon.  No further acute PT needs at this time, will sign off.      Follow Up Recommendations Outpatient PT;Supervision for mobility/OOB    Equipment Recommendations  None recommended by PT    Recommendations for Other Services       Precautions / Restrictions Precautions Precautions: None Required Braces or Orthoses: Other Brace/Splint Other Brace/Splint: pt has R AFO, but only wears when going outside.   Restrictions Weight Bearing Restrictions: No      Mobility  Bed Mobility Overal bed mobility: Needs Assistance Bed Mobility: Supine to Sit     Supine to sit: Min guard     General bed mobility comments: pt sitting in recliner.    Transfers Overall transfer level: Needs assistance Equipment used: Straight cane Transfers: Sit to/from Stand Sit to Stand: Supervision         General transfer comment: pt demonstrates good technique with L UE support.    Ambulation/Gait Ambulation/Gait assistance: Supervision Ambulation Distance (Feet): 100 Feet Assistive device: Straight cane       General Gait Details: pt with R hemi type gait and indicates this is baseline since CVA 2 weeks prior.  pt has AFO for R LE, but indicates she does not wear it  around the house.  pt able to clear floor during swing phase on R, but with effort.  Stairs            Wheelchair Mobility    Modified Rankin (Stroke Patients Only) Modified Rankin (Stroke Patients Only) Pre-Morbid Rankin Score: Moderately severe disability Modified Rankin: Moderately severe disability     Balance Overall balance assessment: Needs assistance         Standing balance support: No upper extremity supported;During functional activity Standing balance-Leahy Scale: Fair                               Pertinent Vitals/Pain Pain Assessment: No/denies pain    Home Living Family/patient expects to be discharged to:: Private residence Living Arrangements: Children Available Help at Discharge: Family Type of Home: House Home Access: Ramped entrance     Home Layout: One level Home Equipment: Cane - quad      Prior Function Level of Independence: Independent with assistive device(s)               Hand Dominance   Dominant Hand: Left    Extremity/Trunk Assessment   Upper Extremity Assessment: Defer to OT evaluation RUE Deficits / Details: residual weakness and tone- wrist able to range ~5 degree past neutral, contractures 3rd 4th 5th digits ( DIP joint) pt reports previous splint but reports it does not fit anymore) Pt with tone present. Pt holding R UE elbow flexed, wrist flexion and  digits flexed         Lower Extremity Assessment: Generalized weakness;RLE deficits/detail RLE Deficits / Details: Residual weakness and increased tone from CVA ~2weeks ago per pt.  pt's functional strength 3-4/5 throughout LE.    Cervical / Trunk Assessment: Kyphotic  Communication   Communication: Expressive difficulties  Cognition Arousal/Alertness: Awake/alert Behavior During Therapy: WFL for tasks assessed/performed Overall Cognitive Status: Within Functional Limits for tasks assessed                      General Comments       Exercises        Assessment/Plan    PT Assessment All further PT needs can be met in the next venue of care  PT Diagnosis Difficulty walking   PT Problem List    PT Treatment Interventions     PT Goals (Current goals can be found in the Care Plan section) Acute Rehab PT Goals PT Goal Formulation: All assessment and education complete, DC therapy    Frequency     Barriers to discharge        Co-evaluation               End of Session   Activity Tolerance: Patient limited by fatigue Patient left: in chair;with call bell/phone within reach;with chair alarm set Nurse Communication: Mobility status    Functional Assessment Tool Used: Clinical Judgement Functional Limitation: Mobility: Walking and moving around Mobility: Walking and Moving Around Current Status (F5379): At least 1 percent but less than 20 percent impaired, limited or restricted Mobility: Walking and Moving Around Goal Status [phone]): At least 1 percent but less than 20 percent impaired, limited or restricted Mobility: Walking and Moving Around Discharge Status [phone]): At least 1 percent but less than 20 percent impaired, limited or restricted    Time: 2957-4734 PT Time Calculation (min) (ACUTE ONLY): 25 min   Charges:   PT Evaluation $Initial PT Evaluation Tier I: 1 Procedure PT Treatments $Gait Training: 8-22 mins   PT G Codes:   PT G-Codes **NOT FOR INPATIENT CLASS** Functional Assessment Tool Used: Clinical Judgement Functional Limitation: Mobility: Walking and moving around Mobility: Walking and Moving Around Current Status (Y3709): At least 1 percent but less than 20 percent impaired, limited or restricted Mobility: Walking and Moving Around Goal Status [phone]): At least 1 percent but less than 20 percent impaired, limited or restricted Mobility: Walking and Moving Around Discharge Status [phone]): At least 1 percent but less than 20 percent impaired, limited or restricted    Catarina Hartshorn, [place] 03/22/2015, 2:15 PM

## 2015-03-22 NOTE — Progress Notes (Signed)
Family Medicine Teaching Service Daily Progress Note Intern Pager: [phone]  Patient name: Andrea Cortez Medical record number: [id number] Date of birth: April 08, 1956 Age: 59 y.o. Gender: female  Primary Care Provider: No primary care provider on file. Consultants: neurology Code Status: DNR (confirmed again this morning)  Pt Overview and Major Events to Date:  06/15: Admitted  Assessment and Plan: Andrea Cortez is a 59 y.o. female presenting with L sided weakness . PMH is significant for h/o CVA, TIA, T2DM, seizure  L sided weakness: CVA vs TIA CT head with no acute infarcts/hemorrhage. 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 H/o CVA in 2004, 2010 and 2 weeks ago with residual deficits of right sided weakness and slurred speech.  MRI/MRA: with seemingly progressed stenosis of R carotid artery compared to 02/2015 study. -telemetry -VS per floor protocol -Will order A1c, Lipid, TSH, Carotid doppler, ECHO if needed. MRI obtained there as well. Daughter not able to bring records from [facility] until late this evening.  Unit Secretary also working to obtain these records this am. -c/s Neuro, appreciate recs -SCDs -c/s PT/OT/SLP -Continue Plavix, Crestor, ASA  LE edema: R>L. New. H/o DVTs. Negative homan's -RLE venous doppler NEG for DVT  T2DM: on Metformin at home -Will need A1c if not obtained 2 weeks ago. -Hold metformin -SSI while hospitalized -CBG monitoring  Seizure d/o: 2/2 stroke sustained in 2004. -Continue home Zonegran  Anxiety/Depression: H/o of severe/prolonged agitation during prior hospitalizations -Continue home Cymbalta, Risperdal -give ativan x1 now for agitation, per daughter she takes klonopin chronically but we do not have a record of this  Peripheral neuropathy:  -Continue home neurontin, flexeril, cymbalta  FEN/GI: NPO until speech eval, SLIV Prophylaxis: SCDs  Disposition: Discharge pending neuro/PT  eval  Subjective:  Patient reports that she feels fine.  Was anxious this am but improving since addition of diet.  She denies any concerns at this time.  Daughter to come by this evening.  Objective: Temp:  [97.6 F (36.4 C)-99.1 F (37.3 C)] 97.8 F (36.6 C) (06/16 1001) Pulse Rate:  [62-97] 69 (06/16 1001) Resp:  [13-20] 18 (06/16 1001) BP: (108-145)/(51-69) 115/51 mmHg (06/16 1001) SpO2:  [94 %-100 %] 100 % (06/16 1001) Weight:  [236 lb 12.8 oz (107.412 kg)] 236 lb 12.8 oz (107.412 kg) (06/15 2132) Physical Exam: General: awake, alert, NAD, L facial droop, sitting up at bedside having crackers Eyes: EOMI, PERRL Cardiovascular: RRR, no murmurs, +2DP Respiratory: CTAB, no increased WOB MSK: 4/5 RLE/RUE, 5/5 LLE/LUE, hand grip decreased on R Neuro: no L sided pronator drift (right side not able to be assessed) or LE cerebellar deficits, hearing decreased R compared to L, L sided facial droop, decreased sensation to R, no dysmetria on L (r not assessed), follows commands, speech slurred (stable from yesterday). Psych: mood stable, patient pleasant  Laboratory:  Recent Labs Lab 03/21/15 1536  WBC 6.2  HGB 12.0  HCT 38.3  PLT 323    Recent Labs Lab 03/21/15 1536  NA 140  K 3.9  CL 108  CO2 25  BUN 9  CREATININE 0.87  CALCIUM 8.9  PROT 6.3*  BILITOT 0.2*  ALKPHOS 70  ALT 20  AST 20  GLUCOSE 126*    Imaging/Diagnostic Tests: Dg Chest 2 View  03/21/2015   CLINICAL DATA:  59 year old female with 3 day history of cough  EXAM: CHEST  2 VIEW  COMPARISON:  Prior chest x-ray 03/06/2015  FINDINGS: Cardiac and mediastinal contours remain within  normal limits. Trace atherosclerotic calcification present within the transverse aorta. Stable background bronchitic change and diffuse mild interstitial prominence. No new focal airspace consolidation, pleural effusion, pneumothorax or pulmonary edema. No acute osseous abnormality.  IMPRESSION: Stable chest x-ray without evidence of  acute cardiopulmonary process.   Electronically Signed   By: Jacqulynn Cadet M.D.   On: 03/21/2015 19:33   Ct Head (brain) Wo Contrast  03/21/2015   CLINICAL DATA:  Stroke  EXAM: CT HEAD WITHOUT CONTRAST  TECHNIQUE: Contiguous axial images were obtained from the base of the skull through the vertex without intravenous contrast.  COMPARISON:  CT head 03/05/2015  FINDINGS: Chronic left MCA infarct is unchanged. Chronic infarct right medial thalamus unchanged  Negative for acute infarct.  Negative for hemorrhage or mass.  Ventricle size is normal.  No shift of the midline structures.  Calvarium intact.  IMPRESSION: Chronic ischemic changes left MCA territory and right thalamus. No acute abnormality.   Electronically Signed   By: Franchot Gallo M.D.   On: 03/21/2015 17:01   Mr Brain Wo Contrast  03/22/2015   CLINICAL DATA:  LEFT-sided weakness, dizziness, difficulty walking, slurred speech and facial droop for 2 days. History of strokes with residual RIGHT-sided deficits. History of seizures, hyperlipidemia.  EXAM: MRI HEAD WITHOUT CONTRAST  MRA HEAD WITHOUT CONTRAST  TECHNIQUE: Multiplanar, multiecho pulse sequences of the brain and surrounding structures were obtained without intravenous contrast. Angiographic images of the head were obtained using MRA technique without contrast.  COMPARISON:  MRI of the head Mar 06, 2015 and CT head March 21, 2015 and MRA head Feb 28, 2012  FINDINGS: MRI HEAD FINDINGS  No reduced diffusion to suggest acute ischemia. No susceptibility artifact to suggest hemorrhage. LEFT frontotemporal parietal encephalomalacia/gliosis with mild ex vacuo dilatation of LEFT occipital horn/atrium. No hydrocephalus. Old RIGHT medial thalamus lacunar infarct. Old LEFT caudate body lacunar infarcts. Mild Wallerian degeneration as evidenced by diminutive cerebral peduncle. Patchy T2 hyperintense signal exclusive of the aforementioned abnormality consistent with mild chronic small vessel ischemic  disease. No midline shift, mass effect, mass lesions.  No abnormal extra-axial fluid collections. Ocular globes and orbital contents are unremarkable. Trace ethmoid mucosal thickening without paranasal sinus air-fluid levels. The mastoid air cells are well aerated. No abnormal sellar expansion. No cerebellar tonsillar ectopia. No suspicious calvarial bone marrow signal. Fatty replaced parotid glands. The patient appears edentulous.  MRA HEAD FINDINGS  Anterior circulation: Normal flow related enhancement of the included cervical, petrous, cavernous internal carotid arteries. Moderate stenosis proximal RIGHT supraclinoid internal carotid artery, progressed from prior study. Carotid termini are patent bilaterally. Normal flow related enhancement anterior cerebral arteries with patent anterior communicating artery. Normal flow related enhancement of the proximal middle cerebral arteries, paucity of distal LEFT MCA branches corresponding to old infarct.  Posterior circulation: Normal flow related enhancement of vertebral arteries, vertebrobasilar junction. Mildly ectatic basilar artery, similar to prior examination. Patent major branch vessels. Normal flow related enhancement of the bilateral posterior cerebral arteries.  No large vessel occlusion, suspicious luminal irregularity aneurysm.  IMPRESSION: MRI HEAD: No acute intracranial process, specifically no acute ischemia.  Stable chronic changes, including old large LEFT middle cerebral artery territory infarct and, old RIGHT thalamus lacunar infarct.  MRA HEAD: No acute large vessel occlusion.  Moderate stenosis RIGHT supraclinoid internal carotid artery, progressed from prior examination.   Electronically Signed   By: Elon Alas M.D.   On: 03/22/2015 04:41   Mr Jodene Nam Head/brain Wo Cm  03/22/2015   CLINICAL DATA:  LEFT-sided weakness, dizziness, difficulty walking, slurred speech and facial droop for 2 days. History of strokes with residual RIGHT-sided  deficits. History of seizures, hyperlipidemia.  EXAM: MRI HEAD WITHOUT CONTRAST  MRA HEAD WITHOUT CONTRAST  TECHNIQUE: Multiplanar, multiecho pulse sequences of the brain and surrounding structures were obtained without intravenous contrast. Angiographic images of the head were obtained using MRA technique without contrast.  COMPARISON:  MRI of the head Mar 06, 2015 and CT head March 21, 2015 and MRA head Feb 28, 2012  FINDINGS: MRI HEAD FINDINGS  No reduced diffusion to suggest acute ischemia. No susceptibility artifact to suggest hemorrhage. LEFT frontotemporal parietal encephalomalacia/gliosis with mild ex vacuo dilatation of LEFT occipital horn/atrium. No hydrocephalus. Old RIGHT medial thalamus lacunar infarct. Old LEFT caudate body lacunar infarcts. Mild Wallerian degeneration as evidenced by diminutive cerebral peduncle. Patchy T2 hyperintense signal exclusive of the aforementioned abnormality consistent with mild chronic small vessel ischemic disease. No midline shift, mass effect, mass lesions.  No abnormal extra-axial fluid collections. Ocular globes and orbital contents are unremarkable. Trace ethmoid mucosal thickening without paranasal sinus air-fluid levels. The mastoid air cells are well aerated. No abnormal sellar expansion. No cerebellar tonsillar ectopia. No suspicious calvarial bone marrow signal. Fatty replaced parotid glands. The patient appears edentulous.  MRA HEAD FINDINGS  Anterior circulation: Normal flow related enhancement of the included cervical, petrous, cavernous internal carotid arteries. Moderate stenosis proximal RIGHT supraclinoid internal carotid artery, progressed from prior study. Carotid termini are patent bilaterally. Normal flow related enhancement anterior cerebral arteries with patent anterior communicating artery. Normal flow related enhancement of the proximal middle cerebral arteries, paucity of distal LEFT MCA branches corresponding to old infarct.  Posterior  circulation: Normal flow related enhancement of vertebral arteries, vertebrobasilar junction. Mildly ectatic basilar artery, similar to prior examination. Patent major branch vessels. Normal flow related enhancement of the bilateral posterior cerebral arteries.  No large vessel occlusion, suspicious luminal irregularity aneurysm.  IMPRESSION: MRI HEAD: No acute intracranial process, specifically no acute ischemia.  Stable chronic changes, including old large LEFT middle cerebral artery territory infarct and, old RIGHT thalamus lacunar infarct.  MRA HEAD: No acute large vessel occlusion.  Moderate stenosis RIGHT supraclinoid internal carotid artery, progressed from prior examination.   Electronically Signed   By: Elon Alas M.D.   On: 03/22/2015 04:41   Janora Norlander, DO 03/22/2015, 12:38 PM PGY-1, [place] Intern pager: [phone], text pages welcome

## 2015-03-22 NOTE — Progress Notes (Signed)
   03/22/15 1100  Clinical Encounter Type  Visited With Patient  Visit Type Initial;Spiritual support;Social support  Referral From Nurse  Spiritual Encounters  Spiritual Needs Prayer  Stress Factors  Patient Stress Factors Exhausted;Family relationships;Health changes  Advance Directives (For Healthcare)  Does patient have an advance directive? Yes  Would patient like information on creating an advanced directive? Yes - Educational materials given  Type of Advance Directive Living will  Does patient want to make changes to advanced directive? Yes - information given   Chaplain was referred to patient via spiritual care consult. Consult indicated patient would like to create or update an advanced directive. Before we talked about advanced directive, patient asked for prayer. Chaplain prayed with patient. Patient shared a lot of her emotional and spiritual struggles before talking about advanced directive. Patient explained that she no longer wants to continue to fight her medical struggles but at the same time she knows God wants her to fight. Patient's daughters also want her to continue to fight and be strong. Patient says she has her ups and downs but she plans on continuing trusting in God. Patient also lost her husband three months ago and that situation has put more pressure on her to be strong for her family. Patient was teary at times but seemed to be responsive to spiritual support.   In terms of advanced directive, patient has a living will that she completed around three years ago. This living will is current and still reflects her decisions and her daughter plans on bringing a copy for [facility] this evening. However, patient does not have a healthcare power of attorney appointed and wants to get this done soon. Patient is afraid her daughters will have drastically different views when a situation arises for them to make decisions. Therefore she wants to make her wishes known and also clarify  who is to make medical decisions for her when she cannot make them herself. Chaplain provided patient with a healthcare power of attorney form. Please contact spiritual care department when patient has completed form and needs it signed and finalized.  Gar Ponto, Chaplain  12:06 PM

## 2015-03-22 NOTE — Progress Notes (Deleted)
Referring Physician: Dr Mingo Amber    Chief Complaint: Left-sided weakness  HPI: Andrea Cortez is an 59 y.o. female with a history of previous strokes in 2004, 2010, and 2 weeks ago with residual right hemiparesis and dysarthria, hyperlipidemia, seizure disorder, and tobacco use who was admitted to [facility] on 03/21/2015 for evaluation of left-sided weakness. An MRI performed today revealed no acute intracranial process, specifically no acute ischemia but old large LEFT middle cerebral artery territory infarct and old RIGHT thalamus lacunar infarct.  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  The patient is allergic to contrast.  The patient lives with her daughter. She has a visiting nurse who assists her. She states that approximately 2 weeks ago she had a stroke and was evaluated at [facility] in [place]. She has residual right-sided weakness, dysarthria, and right visual field deficits. Yesterday she got out of bed at around 11 AM and noted some left-sided weakness. She was having difficulty walking. The home nurse arrived and recommended hospital evaluation. The patient has been on aspirin 81 mg along with Plavix. She states she never misses any doses. She has been ambulating with a walker since her previous stroke.  Date last known well: 03/21/2015 Time last known well: Unable to determine tPA Given: No: Late presentation  Past Medical History  Diagnosis Date  . Stroke 2004 x 2, 2010 x 1    left slightly hemorrhagic left temporal and parietal infarct, new acute right medial thalamus, and left anterior pons --> had extended rehabilitation course // Continues to have residual right UE weakness  . Hyperlipidemia   . Depression     2/2 stroke in 2004  . Seizure 2004 after stroke    developed 2/2 stroke in 2004  . Tobacco abuse 1.5 PPD x 38 years    smoking since 6th  grade; quit 2004  . Varicose vein     B/L LE    Past Surgical History  Procedure Laterality Date  . Partial hysterectomy  1998/1999    uterus removed 2/2 cervical cancer  . Tonsillectomy      as a child    Family History  Problem Relation Age of Onset  . Cancer Mother     lung cancer, died at 90  . Lung disease Father     1 lung removed, death (u/k cause)  . Heart attack Brother     massive MI causing death  . HIV Brother     died from AIDS  . Emphysema Brother     alive  . Other Daughter     hypochondriac   Social History:  reports that she quit smoking about 3 years ago. Her smoking use included Cigarettes. She has a 57 pack-year smoking history. She has never used smokeless tobacco. She reports that she does not drink alcohol or use illicit drugs.  Allergies:  Allergies  Allergen Reactions  . Ivp Dye [Iodinated Diagnostic Agents] Shortness Of Breath  . Niacin And Related Other (See Comments)    Must take flush-free Niacin  . Iron Other (See Comments)    Unknown     Medications:  Scheduled: . aspirin  325 mg Oral Daily  . clopidogrel  75 mg Oral Daily  . DULoxetine  60 mg Oral QHS  . gabapentin  300 mg Oral TID  . montelukast  10 mg Oral q morning - 10a  . pantoprazole  40 mg  Oral Q1200  . risperiDONE  0.25 mg Oral QHS  . rosuvastatin  40 mg Oral QPM  . zonisamide  200 mg Oral QHS    ROS: History obtained from the patient  General ROS: negative for - chills, fatigue, fever, night sweats, weight gain or weight loss. She feels her thinking is clouded since her stroke 2 weeks ago. Psychological ROS: negative for - behavioral disorder, hallucinations, memory difficulties, mood swings or suicidal ideation Ophthalmic ROS: negative for - blurry vision, double vision, eye pain or loss of vision ENT ROS: negative for - epistaxis, nasal discharge, oral lesions, sore throat, tinnitus or vertigo Allergy and Immunology ROS: negative for - hives or itchy/watery  eyes Hematological and Lymphatic ROS: negative for - bleeding problems, bruising or swollen lymph nodes Endocrine ROS: negative for - galactorrhea, hair pattern changes, polydipsia/polyuria or temperature intolerance Respiratory ROS: negative for - hemoptysis, shortness of breath or wheezing. Last Monday she had a cough but this resolved. Cardiovascular ROS: negative for - chest pain, dyspnea on exertion, edema or irregular heartbeat Gastrointestinal ROS: negative for - abdominal pain, diarrhea, hematemesis, nausea/vomiting or stool incontinence Genito-Urinary ROS: negative for - dysuria, hematuria, incontinence or urinary frequency/urgency Musculoskeletal ROS: negative for - joint swelling or muscular weakness Neurological ROS: as noted in HPI Dermatological ROS: negative for rash and skin lesion changes   Physical Examination: Blood pressure 115/51, pulse 69, temperature 97.8 F (36.6 C), temperature source Oral, resp. rate 18, height 6\' 5"  (1.956 m), weight 107.412 kg (236 lb 12.8 oz), SpO2 100 %.  General - pleasant 59 year old female in no acute distress. Psych: slightly flattened affect Eyes: non-injected sclera ENT: No op obstruction Heart - Regular rate and rhythm Lungs - Clear to auscultation Abdomen - Soft - non tender Extremities - Distal pulses intact - trace edema Skin - Warm and dry  Mental Status: Alert, oriented, thought content appropriate. Slow to speak with significant dysarthria. Able to follow 3 step commands without difficulty. Cranial Nerves: II: Discs not visualized; Visual fields consistent with a right visual field deficit, pupils equal, round, reactive to light and accommodation III,IV, VI: ptosis not present, extra-ocular motions intact bilaterally V,VII: Right lower facial droop, facial light touch sensation normal bilaterally VIII: hearing normal bilaterally IX,X: gag reflex present XI: bilateral shoulder shrug XII: midline tongue  extension Motor: Right : Upper extremity   2-3/5    Left:     Upper extremity   5/5  Lower extremity   2-3/5     Lower extremity   5/5 Tone and bulk:normal tone throughout; no atrophy noted Sensory: Light touch intact throughout, bilaterally Deep Tendon Reflexes: 2+ and symmetric throughout Plantars: Right: upgoing   Left: downgoing Cerebellar: normal finger-to-nose with the left upper extremity. Unable to test the right upper extremity. Gait: Deferred CV: pulses palpable throughout    Laboratory Studies:  Basic Metabolic Panel:  Recent Labs Lab 03/21/15 1536  NA 140  K 3.9  CL 108  CO2 25  GLUCOSE 126*  BUN 9  CREATININE 0.87  CALCIUM 8.9    Liver Function Tests:  Recent Labs Lab 03/21/15 1536  AST 20  ALT 20  ALKPHOS 70  BILITOT 0.2*  PROT 6.3*  ALBUMIN 3.6   No results for input(s): LIPASE, AMYLASE in the last 168 hours. No results for input(s): AMMONIA in the last 168 hours.  CBC:  Recent Labs Lab 03/21/15 1536  WBC 6.2  NEUTROABS 3.2  HGB 12.0  HCT 38.3  MCV 82.4  PLT  323    Cardiac Enzymes: No results for input(s): CKTOTAL, CKMB, CKMBINDEX, TROPONINI in the last 168 hours.  BNP: Invalid input(s): POCBNP  CBG:  Recent Labs Lab 03/21/15 1749 03/22/15 0229 03/22/15 0450 03/22/15 0600 03/22/15 0922  GLUCAP 105* 145* 79 108* 105*    Microbiology: No results found for this or any previous visit.  Coagulation Studies:  Recent Labs  03/21/15 1536  LABPROT 13.0  INR 0.96    Urinalysis:  Recent Labs Lab 03/21/15 1541  COLORURINE AMBER*  LABSPEC 1.017  PHURINE 6.5  GLUCOSEU NEGATIVE  HGBUR NEGATIVE  BILIRUBINUR NEGATIVE  KETONESUR NEGATIVE  PROTEINUR NEGATIVE  UROBILINOGEN 1.0  NITRITE NEGATIVE  LEUKOCYTESUR TRACE*    Lipid Panel:    Component Value Date/Time   CHOL 168 02/29/2012 0214   CHOL 170 02/29/2012 0214   TRIG 412* 02/29/2012 0214   TRIG 412* 02/29/2012 0214   HDL 36* 02/29/2012 0214   HDL 35*  02/29/2012 0214   CHOLHDL 4.7 02/29/2012 0214   CHOLHDL 4.9 02/29/2012 0214   VLDL UNABLE TO CALCULATE IF TRIGLYCERIDE OVER 400 mg/dL 02/29/2012 0214   VLDL UNABLE TO CALCULATE IF TRIGLYCERIDE OVER 400 mg/dL 02/29/2012 0214   LDLCALC UNABLE TO CALCULATE IF TRIGLYCERIDE OVER 400 mg/dL 02/29/2012 0214   LDLCALC UNABLE TO CALCULATE IF TRIGLYCERIDE OVER 400 mg/dL 02/29/2012 0214    HgbA1C:  Lab Results  Component Value Date   HGBA1C 7.2* 02/29/2012    Urine Drug Screen:  No results found for: LABOPIA, [place], [place], AMPHETMU, THCU, [place]  Alcohol Level:  Recent Labs Lab 03/21/15 [place] <5    Other results: EKG: Sinus rhythm rate 97 bpm. Please refer to the formal cardiology reading for complete details.   Imaging:   Dg Chest 2 View 03/21/2015    Stable background bronchitic change and diffStable chest x-ray without evidence of acute cardiopulmonary process.      Ct Head (brain) Wo Contrast 03/21/2015    Chronic ischemic changes left MCA territory and right thalamus. No acute abnormality.        Mr Brain Wo Contrast 03/22/2015    MRI HEAD:  No acute intracranial process, specifically no acute ischemia.  Stable chronic changes, including old large LEFT middle cerebral artery territory infarct and, old RIGHT thalamus lacunar infarct.   MRA HEAD:  No acute large vessel occlusion.  Moderate stenosis RIGHT supraclinoid internal carotid artery, progressed from prior examination.        Assessment: 59 y.o. female presents for evaluation of left sided weakness following a recent left middle cerebral artery territory infarct approximately 2 weeks ago. The MRI shows no acute process. No left-sided deficits noted on exam. This patient would probably benefit from inpatient rehabilitation.  Stroke Risk Factors - hyperlipidemia and smoking   Dr. Leonel Ramsay to see for further evaluation and plan.   Mikey Bussing PA-C [facility] Pager [phone] 03/22/2015, 10:48 AM  I have seen and evaluated the patient. I have reviewed the above note.  I feel that she does have weakness in her left leg but not as much as her right. She has an underlying right hemipareiss from previosu stroke. She describes progressive LLE weakness. Arm is not affected at all. She has 3+ reflexes with crossed adductors bilaterally. upgoing toe on right, not on left.  This could be from previous ischmic injuries, but I would favor ruling out a spinal lesion.   No evidence of new stroke.   1) MRI t and L  spine 2) PT  Roland Rack, MD [facility] [phone]  If 7pm- 7am, please page neurology on call as listed in [place].

## 2015-03-22 NOTE — Progress Notes (Addendum)
*  PRELIMINARY RESULTS* Vascular Ultrasound  Right Lower Extremity Venous Duplex Completed.  PRELIMINARY RESULTS  PRELIMINARY RESULTS  PRELIMINARY RESULTS  Peliminary Report: Right: no evidence of DVT, Superficial Thrombosis, or Backer's Cyst.   Darlina Sicilian M 03/22/2015, 9:07 AM

## 2015-03-22 NOTE — Evaluation (Signed)
Occupational Therapy Evaluation Patient Details Name: Andrea Cortez MRN: [id number] DOB: [birth year] Today's Date: 03/22/2015    History of Present Illness  59 y.o. female presenting with L sided weakness and slurred speech. 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 Hosptialized 2 weeks ago at [facility] for CVA however MRI at that time unable to visualize new CVA. MRI 6/16 without acute CVA. CXR negative.   Clinical Impression   Patient evaluated by Occupational Therapy with no further acute OT needs identified. All education has been completed and the patient has no further questions. See below for any follow-up Occupational Therapy or equipment needs. OT to sign off. Thank you for referral.      Follow Up Recommendations  No OT follow up    Equipment Recommendations  None recommended by OT    Recommendations for Other Services       Precautions / Restrictions Precautions Precautions: None Restrictions Weight Bearing Restrictions: No      Mobility Bed Mobility Overal bed mobility: Needs Assistance Bed Mobility: Supine to Sit     Supine to sit: Min guard     General bed mobility comments: requires use of bed rail on L side and incr time  Transfers Overall transfer level: Needs assistance   Transfers: Sit to/from Stand Sit to Stand: Supervision              Balance                                            ADL Overall ADL's : At baseline                                       General ADL Comments: Pt demonstrates full dressing, toilet transfer, sink level grooming, and simulated tub transfer using grab bar.     Vision     Perception     Praxis      Pertinent Vitals/Pain Pain Assessment: No/denies pain     Hand Dominance Left   Extremity/Trunk Assessment Upper Extremity Assessment Upper  Extremity Assessment: RUE deficits/detail RUE Deficits / Details: residual weakness and tone- wrist able to range ~5 degree past neutral, contractures 3rd 4th 5th digits ( DIP joint) pt reports previous splint but reports it does not fit anymore) Pt with tone present. Pt holding R UE elbow flexed, wrist flexion and digits flexed   Lower Extremity Assessment Lower Extremity Assessment: Defer to PT evaluation   Cervical / Trunk Assessment Cervical / Trunk Assessment: Kyphotic   Communication Communication Communication: Expressive difficulties   Cognition Arousal/Alertness: Awake/alert Behavior During Therapy: WFL for tasks assessed/performed Overall Cognitive Status: Within Functional Limits for tasks assessed                     General Comments       Exercises       Shoulder Instructions      Home Living Family/patient expects to be discharged to:: Private residence Living Arrangements: Children Available Help at Discharge: Family Type of Home: House Home Access: Ramped entrance     Home Layout: One level     Bathroom Shower/Tub: Teacher, early years/pre: Standard  Home Equipment: Cane - quad          Prior Functioning/Environment Level of Independence: Independent with assistive device(s)             OT Diagnosis: Generalized weakness   OT Problem List:     OT Treatment/Interventions:      OT Goals(Current goals can be found in the care plan section) Acute Rehab OT Goals OT Goal Formulation: With patient Potential to Achieve Goals: Good  OT Frequency:     Barriers to D/C:            Co-evaluation              End of Session Equipment Utilized During Treatment: Gait belt Nurse Communication: Mobility status;Precautions  Activity Tolerance: Patient tolerated treatment well Patient left: in chair;with call bell/phone within reach;with chair alarm set   Time: 6226-3335 OT Time Calculation (min): 28 min Charges:   OT General Charges $OT Visit: 1 Procedure OT Evaluation $Initial OT Evaluation Tier I: 1 Procedure OT Treatments $Self Care/Home Management : 8-22 mins G-Codes: OT G-codes **NOT FOR INPATIENT CLASS** Functional Assessment Tool Used: clinical judgement Functional Limitation: Self care Self Care Current Status (K5625): At least 1 percent but less than 20 percent impaired, limited or restricted Self Care Goal Status (W3893): At least 1 percent but less than 20 percent impaired, limited or restricted Self Care Discharge Status [phone]): At least 1 percent but less than 20 percent impaired, limited or restricted  Peri Maris 03/22/2015, 11:50 AM  Pager: [phone]

## 2015-03-23 DIAGNOSIS — Z8673 Personal history of transient ischemic attack (TIA), and cerebral infarction without residual deficits: Secondary | ICD-10-CM | POA: Diagnosis not present

## 2015-03-23 DIAGNOSIS — R29810 Facial weakness: Secondary | ICD-10-CM | POA: Diagnosis not present

## 2015-03-23 DIAGNOSIS — M6289 Other specified disorders of muscle: Secondary | ICD-10-CM | POA: Diagnosis not present

## 2015-03-23 DIAGNOSIS — R29898 Other symptoms and signs involving the musculoskeletal system: Secondary | ICD-10-CM | POA: Insufficient documentation

## 2015-03-23 LAB — GLUCOSE, CAPILLARY
GLUCOSE-CAPILLARY: 105 mg/dL — AB (ref 65–99)
Glucose-Capillary: 118 mg/dL — ABNORMAL HIGH (ref 65–99)
Glucose-Capillary: 132 mg/dL — ABNORMAL HIGH (ref 65–99)

## 2015-03-23 MED ORDER — METFORMIN HCL 500 MG PO TABS: 500.0000 mg | ORAL_TABLET | Freq: Two times a day (BID) | ORAL | Status: AC

## 2015-03-23 MED ORDER — CLOPIDOGREL BISULFATE 75 MG PO TABS: 75.0000 mg | ORAL_TABLET | Freq: Every day | ORAL | Status: DC

## 2015-03-23 NOTE — Consult Note (Signed)
Referring Physician: Dr Mingo Amber    Chief Complaint: Left-sided weakness  HPI: Andrea Cortez is an 59 y.o. female with a history of previous strokes in 2004, 2010, and 2 weeks ago with residual right hemiparesis and dysarthria, hyperlipidemia, seizure disorder, and tobacco use who was admitted to [facility] on 03/21/2015 for evaluation of left-sided weakness. An MRI performed today revealed no acute intracranial process, specifically no acute ischemia but old large LEFT middle cerebral artery territory infarct and old RIGHT thalamus lacunar infarct.  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  The patient is allergic to contrast.  The patient lives with her daughter. She has a visiting nurse who assists her. She states that approximately 2 weeks ago she had a stroke and was evaluated at [facility] in [place]. She has residual right-sided weakness, dysarthria, and right visual field deficits. Yesterday she got out of bed at around 11 AM and noted some left-sided weakness. She was having difficulty walking. The home nurse arrived and recommended hospital evaluation. The patient has been on aspirin 81 mg along with Plavix. She states she never misses any doses. She has been ambulating with a walker since her previous stroke.  Date last known well: 03/21/2015 Time last known well: Unable to determine tPA Given: No: Late presentation  Past Medical History  Diagnosis Date  . Stroke 2004 x 2, 2010 x 1    left slightly hemorrhagic left temporal and parietal infarct, new acute right medial thalamus, and left anterior pons --> had extended rehabilitation course // Continues to have residual right UE weakness  . Hyperlipidemia   . Depression     2/2 stroke in 2004  . Seizure 2004 after stroke    developed 2/2 stroke in 2004  . Tobacco abuse 1.5 PPD x 38 years    smoking since 6th  grade; quit 2004  . Varicose vein     B/L LE    Past Surgical History  Procedure Laterality Date  . Partial hysterectomy  1998/1999    uterus removed 2/2 cervical cancer  . Tonsillectomy      as a child    Family History  Problem Relation Age of Onset  . Cancer Mother     lung cancer, died at 61  . Lung disease Father     1 lung removed, death (u/k cause)  . Heart attack Brother     massive MI causing death  . HIV Brother     died from AIDS  . Emphysema Brother     alive  . Other Daughter     hypochondriac   Social History:  reports that she quit smoking about 3 years ago. Her smoking use included Cigarettes. She has a 57 pack-year smoking history. She has never used smokeless tobacco. She reports that she does not drink alcohol or use illicit drugs.  Allergies:  Allergies  Allergen Reactions  . Ivp Dye [Iodinated Diagnostic Agents] Shortness Of Breath  . Niacin And Related Other (See Comments)    Must take flush-free Niacin  . Iron Other (See Comments)    Unknown     Medications:  Scheduled: . aspirin  325 mg Oral Daily  . clopidogrel  75 mg Oral Daily  . DULoxetine  60 mg Oral QHS  . gabapentin  300 mg Oral TID  . insulin aspart  0-4 Units Subcutaneous TID WC  . montelukast  10 mg Oral  q morning - 10a  . pantoprazole  40 mg Oral Q1200  . risperiDONE  0.25 mg Oral QHS  . rosuvastatin  40 mg Oral QPM  . zonisamide  200 mg Oral QHS    ROS: History obtained from the patient  General ROS: negative for - chills, fatigue, fever, night sweats, weight gain or weight loss. She feels her thinking is clouded since her stroke 2 weeks ago. Psychological ROS: negative for - behavioral disorder, hallucinations, memory difficulties, mood swings or suicidal ideation Ophthalmic ROS: negative for - blurry vision, double vision, eye pain or loss of vision ENT ROS: negative for - epistaxis, nasal discharge, oral lesions, sore throat, tinnitus or vertigo Allergy and Immunology  ROS: negative for - hives or itchy/watery eyes Hematological and Lymphatic ROS: negative for - bleeding problems, bruising or swollen lymph nodes Endocrine ROS: negative for - galactorrhea, hair pattern changes, polydipsia/polyuria or temperature intolerance Respiratory ROS: negative for - hemoptysis, shortness of breath or wheezing. Last Monday she had a cough but this resolved. Cardiovascular ROS: negative for - chest pain, dyspnea on exertion, edema or irregular heartbeat Gastrointestinal ROS: negative for - abdominal pain, diarrhea, hematemesis, nausea/vomiting or stool incontinence Genito-Urinary ROS: negative for - dysuria, hematuria, incontinence or urinary frequency/urgency Musculoskeletal ROS: negative for - joint swelling or muscular weakness Neurological ROS: as noted in HPI Dermatological ROS: negative for rash and skin lesion changes   Physical Examination: Blood pressure 109/63, pulse 74, temperature 97.9 F (36.6 C), temperature source Oral, resp. rate 20, height 6\' 5"  (1.956 m), weight 107.412 kg (236 lb 12.8 oz), SpO2 96 %.  General - pleasant 59 year old female in no acute distress. Psych: slightly flattened affect Eyes: non-injected sclera ENT: No op obstruction Heart - Regular rate and rhythm Lungs - Clear to auscultation Abdomen - Soft - non tender Extremities - Distal pulses intact - trace edema Skin - Warm and dry  Mental Status: Alert, oriented, thought content appropriate. Slow to speak with significant dysarthria. Able to follow 3 step commands without difficulty. Cranial Nerves: II: Discs not visualized; Visual fields consistent with a right visual field deficit, pupils equal, round, reactive to light and accommodation III,IV, VI: ptosis not present, extra-ocular motions intact bilaterally V,VII: Right lower facial droop, facial light touch sensation normal bilaterally VIII: hearing normal bilaterally IX,X: gag reflex present XI: bilateral shoulder  shrug XII: midline tongue extension Motor: Right : Upper extremity   2-3/5    Left:     Upper extremity   5/5  Lower extremity   2-3/5     Lower extremity   5/5 Tone and bulk:normal tone throughout; no atrophy noted Sensory: Light touch intact throughout, bilaterally Deep Tendon Reflexes: 2+ and symmetric throughout Plantars: Right: upgoing   Left: downgoing Cerebellar: normal finger-to-nose with the left upper extremity. Unable to test the right upper extremity. Gait: Deferred CV: pulses palpable throughout    Laboratory Studies:  Basic Metabolic Panel:  Recent Labs Lab 03/21/15 1536  NA 140  K 3.9  CL 108  CO2 25  GLUCOSE 126*  BUN 9  CREATININE 0.87  CALCIUM 8.9    Liver Function Tests:  Recent Labs Lab 03/21/15 1536  AST 20  ALT 20  ALKPHOS 70  BILITOT 0.2*  PROT 6.3*  ALBUMIN 3.6   No results for input(s): LIPASE, AMYLASE in the last 168 hours. No results for input(s): AMMONIA in the last 168 hours.  CBC:  Recent Labs Lab 03/21/15 1536  WBC 6.2  NEUTROABS 3.2  HGB 12.0  HCT 38.3  MCV 82.4  PLT 323    Cardiac Enzymes: No results for input(s): CKTOTAL, CKMB, CKMBINDEX, TROPONINI in the last 168 hours.  BNP: Invalid input(s): POCBNP  CBG:  Recent Labs Lab 03/22/15 1636 03/22/15 2131 03/23/15 0641 03/23/15 1109 03/23/15 1610  GLUCAP 97 134* 118* 132* 105*    Microbiology: No results found for this or any previous visit.  Coagulation Studies:  Recent Labs  03/21/15 1536  LABPROT 13.0  INR 0.96    Urinalysis:   Recent Labs Lab 03/21/15 1541  COLORURINE AMBER*  LABSPEC 1.017  PHURINE 6.5  GLUCOSEU NEGATIVE  HGBUR NEGATIVE  BILIRUBINUR NEGATIVE  KETONESUR NEGATIVE  PROTEINUR NEGATIVE  UROBILINOGEN 1.0  NITRITE NEGATIVE  LEUKOCYTESUR TRACE*    Lipid Panel:    Component Value Date/Time   CHOL 168 02/29/2012 0214   CHOL 170 02/29/2012 0214   TRIG 412* 02/29/2012 0214   TRIG 412* 02/29/2012 0214   HDL 36*  02/29/2012 0214   HDL 35* 02/29/2012 0214   CHOLHDL 4.7 02/29/2012 0214   CHOLHDL 4.9 02/29/2012 0214   VLDL UNABLE TO CALCULATE IF TRIGLYCERIDE OVER 400 mg/dL 02/29/2012 0214   VLDL UNABLE TO CALCULATE IF TRIGLYCERIDE OVER 400 mg/dL 02/29/2012 0214   LDLCALC UNABLE TO CALCULATE IF TRIGLYCERIDE OVER 400 mg/dL 02/29/2012 0214   LDLCALC UNABLE TO CALCULATE IF TRIGLYCERIDE OVER 400 mg/dL 02/29/2012 0214    HgbA1C:  Lab Results  Component Value Date   HGBA1C 7.2* 02/29/2012    Urine Drug Screen:      Component Value Date/Time   LABOPIA NONE DETECTED 03/21/2015 1541   COCAINSCRNUR NONE DETECTED 03/21/2015 1541   LABBENZ NONE DETECTED 03/21/2015 1541   AMPHETMU NONE DETECTED 03/21/2015 1541   THCU NONE DETECTED 03/21/2015 1541   LABBARB NONE DETECTED 03/21/2015 1541    Alcohol Level:   Recent Labs Lab 03/21/15 [place] <5    Other results: EKG: Sinus rhythm rate 97 bpm. Please refer to the formal cardiology reading for complete details.   Imaging:   Dg Chest 2 View 03/21/2015    Stable background bronchitic change and diffStable chest x-ray without evidence of acute cardiopulmonary process.      Ct Head (brain) Wo Contrast 03/21/2015    Chronic ischemic changes left MCA territory and right thalamus. No acute abnormality.        Mr Brain Wo Contrast 03/22/2015    MRI HEAD:  No acute intracranial process, specifically no acute ischemia.  Stable chronic changes, including old large LEFT middle cerebral artery territory infarct and, old RIGHT thalamus lacunar infarct.   MRA HEAD:  No acute large vessel occlusion.  Moderate stenosis RIGHT supraclinoid internal carotid artery, progressed from prior examination.        Assessment: 59 y.o. female presents for evaluation of left sided weakness following a recent left middle cerebral artery territory infarct approximately 2 weeks ago. The MRI shows no acute process. No left-sided deficits noted on exam. This patient would  probably benefit from inpatient rehabilitation.  Stroke Risk Factors - hyperlipidemia and smoking   Dr. Leonel Ramsay to see for further evaluation and plan.   Mikey Bussing PA-C [facility] Pager [phone] 03/23/2015, 7:15 PM  I have seen and evaluated the patient. I have reviewed the above note.  I feel that she does have weakness in her left leg but not as much as her right. She has an underlying right hemipareiss from previosu stroke. She describes progressive LLE  weakness. Arm is not affected at all. She has 3+ reflexes with crossed adductors bilaterally. upgoing toe on right, not on left.  This could be from previous ischmic injuries, but I would favor ruling out a spinal lesion.   No evidence of new stroke.   1) MRI t and L spine 2) PT  Roland Rack, MD [facility] [phone]  If 7pm- 7am, please page neurology on call as listed in [place].

## 2015-03-23 NOTE — Progress Notes (Signed)
Spoke with physical therapy, who states that patient will be attending a day program in [place], where she will also be receiving outpatient therapy services.  Patient will require a paper script for outpatient therapies at discharge.  Message left on the chart for attending MD/Resident.

## 2015-03-23 NOTE — Discharge Instructions (Signed)
You were admitted for concern regarding stroke.  No evidence was found on MRI/CT of NEW stroke.  You also had a MRI of your back that did not reveal why you have new L sided weakness.  PT and OT has evaluated you and are recommending continued outpatient therapy.   Ischemic Stroke Blood carries oxygen to all areas of your body. A stroke happens when your blood does not flow to your brain like normal. If this happens, your brain will not get the oxygen it needs and brain tissue will die. This is an emergency. Problems (symptoms) of a stroke usually happen suddenly. You may notice them when you wake up. They can include:  Loss of feeling or weakness on one side of the body (face, arm, leg).  Feeling confused.  Trouble talking or understanding.  Trouble seeing.  Trouble walking.  Feeling dizzy.  Loss of balance or coordination.  Severe headache without a cause.  Trouble reading or writing. Get help as soon as any of these problems first start. This is important.  RISK FACTORS  Risk factors are things that make it more likely for you to have a stroke. These things include:  High blood pressure (hypertension).  High cholesterol.  Diabetes.  Heart disease.  Having a buildup of fatty deposits in the blood vessels.  Having an abnormal heart rhythm (atrial fibrillation).  Being very overweight (obese).  Smoking.  Taking birth control pills, especially if you smoke.  Not being active.  Having a diet high in fats, salt, and calories.  Drinking too much alcohol.  Using illegal drugs.  Being African American.  Being over the age of 31.  Having a family history of stroke.  Having a history of blood clots, stroke, warning stroke (transient ischemic attack, TIA), or heart attack.  Sickle cell disease. HOME CARE  Take all medicines exactly as told by your doctor. Understand all your medicine instructions.  You may need to take a medicine to thin your blood, like  aspirin or warfarin. Take warfarin exactly as told.  Taking too much or too little warfarin is dangerous. Get regular blood tests as told, including the PT and INR tests. The test results help your doctor adjust your dose of warfarin. Your PT and INR levels must be done as often as told by your doctor.  Food can cause problems with warfarin and affect the results of your blood tests. This is true for foods high in vitamin K, such as spinach, kale, broccoli, cabbage, collard and turnip greens, Brussels sprouts, peas, cauliflower, seaweed, and parsley, as well as beef and pork liver, green tea, and soybean oil. Eat the same amount of food high in vitamin K. Avoid major changes in your diet. Tell your doctor before changing your diet. Talk to a food specialist (dietitian) if you have questions.  Many medicines can cause problems with warfarin and affect your PT and INR test results. Tell your doctor about all medicines you take. This includes vitamins and dietary pills (supplements). Be careful with aspirin and medicines that relieve redness, soreness, and puffiness (inflammation). Do not take or stop medicines unless your doctor tells you to.  Warfarin can cause a lot of bruising or bleeding. Hold pressure over cuts for longer than normal. Talk to your doctor about other side effects of warfarin.  Avoid sports or activities that may cause injury or bleeding.  Be careful when you shave, floss your teeth, or use sharp objects.  Avoid alcoholic drinks or drink very  little alcohol while taking warfarin. Tell your doctor if you change how much alcohol you drink.  Tell your dentist and other doctors that you take warfarin before procedures.  If you are able to swallow, eat healthy foods. Eat 5 or more servings of fruits and vegetables a day. Eat soft foods, pureed foods, or eat small bites of food so you do not choke.  Follow your diet program as told, if you are given one.  Keep a healthy  weight.  Stay active. Try to get at least 30 minutes of activity on most or all days.  Do not smoke.  Limit how much alcohol you drink even if you are not taking warfarin. Moderate alcohol use is:  No more than 2 drinks each day for men.  No more than 1 drink each day for women who are not pregnant.  Keep your home safe so you do not fall. Try:  Putting grab bars in the bedroom and bathroom.  Raising toilet seats.  Putting a seat in the shower.  Go to therapy sessions (physical, occupational, and speech) as told by your doctor.  Use a walker or cane at all times if told to do so.  Keep all doctor visits as told. GET HELP IF:  Your personality changes.  You have trouble swallowing.  You are seeing two of everything.  You are dizzy.  You have a fever.  Your skin starts to break down. GET HELP RIGHT AWAY IF:  The symptoms below may be a sign of an emergency. Do not wait to see if the symptoms go away. Call for help (911 in [place] Do not drive yourself to the hospital.  You have sudden weakness or numbness on the face, arm, or leg (especially on one side of the body).  You have sudden trouble walking or moving your arms or legs.  You have sudden confusion.  You have trouble talking or understanding.  You have sudden trouble seeing in one or both eyes.  You lose your balance or your movements are not smooth.  You have a sudden, severe headache with no known cause.  You have new chest pain or you feel your heart beating in an unsteady way.  You are partly or totally unaware of what is going on around you. Document Released: 09/11/2011 Document Revised: 02/06/2014 Document Reviewed: 05/02/2012 [facility] Patient Information 2015 [place], [place]. This information is not intended to replace advice given to you by your health care provider. Make sure you discuss any questions you have with your health care provider.

## 2015-03-23 NOTE — Progress Notes (Signed)
Family Medicine Teaching Service Daily Progress Note Intern Pager: [phone]  Patient name: Andrea Cortez Medical record number: [id number] Date of birth: [birth year] Age: 59 y.o. Gender: female  Primary Care Provider: No primary care provider on file. Consultants: neurology Code Status: DNR (confirmed)  Pt Overview and Major Events to Date:  06/15: Admitted  Assessment and Plan: Andrea Cortez is a 59 y.o. female presenting with L sided weakness . PMH is significant for h/o CVA, TIA, T2DM, seizure  L sided weakness: CVA vs TIA CT head with no acute infarcts/hemorrhage. 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 H/o CVA in 2004, 2010 and 2 weeks ago with residual deficits of right sided weakness and slurred speech.  MRI/MRA: with seemingly progressed stenosis of R carotid artery compared to 02/2015 study. MR T/L spine: Mild thoracic spondylosis without stenosis. Minimal impression on the spinal cord from a small T8-9 disc protrusion. Mild lumbar spondylosis, most notable at L4-5 where there is mild spinal stenosis. -telemetry -VS per floor protocol -Will order A1c, Lipid, TSH, Carotid doppler, ECHO if needed. MRI obtained there as well. Unit secretary working on this.  Daughter did not come by last night with records. -c/s Neuro, appreciate recs -SCDs -c/s PT/OT/SLP: No OT, Outpt PT, cleared by speech at bedside -Continue Plavix, Crestor, ASA  LE edema: R>L. New. H/o DVTs. Negative homan's -RLE venous doppler NEG for DVT  T2DM: on Metformin at home -Will need A1c if not obtained 2 weeks ago. -Hold metformin -SSI while hospitalized -CBG monitoring  Seizure d/o: 2/2 stroke sustained in 2004. -Continue home Zonegran  Anxiety/Depression: H/o of severe/prolonged agitation during prior hospitalizations -Continue home Cymbalta, Risperdal -give ativan x1 now for agitation, per daughter she takes klonopin chronically but we do not have a record of  this  Peripheral neuropathy:  -Continue home neurontin, flexeril, cymbalta  FEN/GI: NPO until speech eval, SLIV Prophylaxis: SCDs  Disposition: Discharge likely today.  Subjective:  Patient reports that she feels better than yesterday.  Daughter was unable to come last night with records.  She does ask that if we discharge patient to please call her daughter and she will pick her up.  She denies any concerns at this time.  Daughter to come by this evening if patient still here.  Objective: Temp:  [97.5 F (36.4 C)-99.9 F (37.7 C)] 97.5 F (36.4 C) (06/17 0546) Pulse Rate:  [69-81] 73 (06/17 0546) Resp:  [16-18] 16 (06/17 0546) BP: (115-153)/(51-71) 153/71 mmHg (06/17 0546) SpO2:  [94 %-100 %] 98 % (06/17 0546) Physical Exam: General: awake, alert, NAD, R sided facial droop (please note that previous exam was mis-documented), sitting up at bedside having breakfast Eyes: EOMI Cardiovascular: RRR, no murmurs, +2DP Respiratory: CTAB, no increased WOB MSK: 4/5 RLE/RUE, 5/5 LLE/LUE, hand grip decreased on R Neuro: no L sided pronator drift (right side not able to be assessed) or LE cerebellar deficits, hearing decreased R compared to L, L sided facial droop, decreased sensation to R, no dysmetria on L (r not assessed), follows commands, speech slurred (stable from yesterday). Psych: mood stable, patient pleasant  Laboratory:  Recent Labs Lab 03/21/15 1536  WBC 6.2  HGB 12.0  HCT 38.3  PLT 323    Recent Labs Lab 03/21/15 1536  NA 140  K 3.9  CL 108  CO2 25  BUN 9  CREATININE 0.87  CALCIUM 8.9  PROT 6.3*  BILITOT 0.2*  ALKPHOS 70  ALT 20  AST 20  GLUCOSE 126*  Imaging/Diagnostic Tests: Dg Chest 2 View  03/21/2015   CLINICAL DATA:  59 year old female with 3 day history of cough  EXAM: CHEST  2 VIEW  COMPARISON:  Prior chest x-ray 03/06/2015  FINDINGS: Cardiac and mediastinal contours remain within normal limits. Trace atherosclerotic calcification present  within the transverse aorta. Stable background bronchitic change and diffuse mild interstitial prominence. No new focal airspace consolidation, pleural effusion, pneumothorax or pulmonary edema. No acute osseous abnormality.  IMPRESSION: Stable chest x-ray without evidence of acute cardiopulmonary process.   Electronically Signed   By: Jacqulynn Cadet M.D.   On: 03/21/2015 19:33   Ct Head (brain) Wo Contrast  03/21/2015   CLINICAL DATA:  Stroke  EXAM: CT HEAD WITHOUT CONTRAST  TECHNIQUE: Contiguous axial images were obtained from the base of the skull through the vertex without intravenous contrast.  COMPARISON:  CT head 03/05/2015  FINDINGS: Chronic left MCA infarct is unchanged. Chronic infarct right medial thalamus unchanged  Negative for acute infarct.  Negative for hemorrhage or mass.  Ventricle size is normal.  No shift of the midline structures.  Calvarium intact.  IMPRESSION: Chronic ischemic changes left MCA territory and right thalamus. No acute abnormality.   Electronically Signed   By: Franchot Gallo M.D.   On: 03/21/2015 17:01   Mr Brain Wo Contrast  03/22/2015   CLINICAL DATA:  LEFT-sided weakness, dizziness, difficulty walking, slurred speech and facial droop for 2 days. History of strokes with residual RIGHT-sided deficits. History of seizures, hyperlipidemia.  EXAM: MRI HEAD WITHOUT CONTRAST  MRA HEAD WITHOUT CONTRAST  TECHNIQUE: Multiplanar, multiecho pulse sequences of the brain and surrounding structures were obtained without intravenous contrast. Angiographic images of the head were obtained using MRA technique without contrast.  COMPARISON:  MRI of the head Mar 06, 2015 and CT head March 21, 2015 and MRA head Feb 28, 2012  FINDINGS: MRI HEAD FINDINGS  No reduced diffusion to suggest acute ischemia. No susceptibility artifact to suggest hemorrhage. LEFT frontotemporal parietal encephalomalacia/gliosis with mild ex vacuo dilatation of LEFT occipital horn/atrium. No hydrocephalus. Old RIGHT  medial thalamus lacunar infarct. Old LEFT caudate body lacunar infarcts. Mild Wallerian degeneration as evidenced by diminutive cerebral peduncle. Patchy T2 hyperintense signal exclusive of the aforementioned abnormality consistent with mild chronic small vessel ischemic disease. No midline shift, mass effect, mass lesions.  No abnormal extra-axial fluid collections. Ocular globes and orbital contents are unremarkable. Trace ethmoid mucosal thickening without paranasal sinus air-fluid levels. The mastoid air cells are well aerated. No abnormal sellar expansion. No cerebellar tonsillar ectopia. No suspicious calvarial bone marrow signal. Fatty replaced parotid glands. The patient appears edentulous.  MRA HEAD FINDINGS  Anterior circulation: Normal flow related enhancement of the included cervical, petrous, cavernous internal carotid arteries. Moderate stenosis proximal RIGHT supraclinoid internal carotid artery, progressed from prior study. Carotid termini are patent bilaterally. Normal flow related enhancement anterior cerebral arteries with patent anterior communicating artery. Normal flow related enhancement of the proximal middle cerebral arteries, paucity of distal LEFT MCA branches corresponding to old infarct.  Posterior circulation: Normal flow related enhancement of vertebral arteries, vertebrobasilar junction. Mildly ectatic basilar artery, similar to prior examination. Patent major branch vessels. Normal flow related enhancement of the bilateral posterior cerebral arteries.  No large vessel occlusion, suspicious luminal irregularity aneurysm.  IMPRESSION: MRI HEAD: No acute intracranial process, specifically no acute ischemia.  Stable chronic changes, including old large LEFT middle cerebral artery territory infarct and, old RIGHT thalamus lacunar infarct.  MRA HEAD: No acute large vessel  occlusion.  Moderate stenosis RIGHT supraclinoid internal carotid artery, progressed from prior examination.    Electronically Signed   By: Elon Alas M.D.   On: 03/22/2015 04:41   Mr Thoracic Spine Wo Contrast  03/22/2015   CLINICAL DATA:  Progressive left lower extremity weakness.  EXAM: MRI THORACIC AND LUMBAR SPINE WITHOUT CONTRAST  TECHNIQUE: Multiplanar and multiecho pulse sequences of the thoracic and lumbar spine were obtained without intravenous contrast.  COMPARISON:  None.  FINDINGS: MR THORACIC SPINE FINDINGS  There is no listhesis. Vertebral body heights are preserved. Vertebral bone marrow signal is within normal limits aside from minimal degenerative endplate changes. Thoracic spinal cord is normal in caliber and signal. There are trace bilateral pleural effusions.  T1-2:  Small left central disc protrusion without stenosis.  T2-3:  Ligamentum flavum thickening without stenosis.  T3-4:  Right facet hypertrophy without stenosis.  T4-5: Bilateral ligamentum flavum and facet hypertrophy without stenosis.  T5-6:  Negative.  T6-7: Asymmetric right facet/ligamentous hypertrophy without stenosis.  T7-8:  Mild facet hypertrophy without stenosis.  T8-9: Small central disc protrusion results in a minimal impression on the spinal cord. No stenosis.  T9-10:  Negative.  T10-11:  Focal left ligamentum flavum thickening without stenosis.  T11-12:  At most minimal disc bulging without stenosis.  T12-L1:  Negative.  MR LUMBAR SPINE FINDINGS  Vertebral alignment is normal. Vertebral body heights an intervertebral disc space heights are preserved. Relatively diffuse disc desiccation is present. Vertebral bone marrow signal is within normal limits. Conus medullaris is normal in signal and terminates at L1. Small T2 hyperintense bilateral renal lesions likely represent cysts but are incompletely evaluated. Atheromatous changes noted in the abdominal aorta.  L1-2:  Negative.  L2-3:  Minimal disc bulging without stenosis.  L3-4: Minimal disc bulging and minimal facet hypertrophy without significant stenosis.  L4-5: Mild  disc bulging and mild-to-moderate facet hypertrophy result in mild spinal stenosis, mild right lateral recess stenosis, and no significant neural foraminal stenosis.  L5-S1:  Mild-to-moderate facet hypertrophy without stenosis.  IMPRESSION: MR THORACIC SPINE IMPRESSION  Mild thoracic spondylosis without stenosis. Minimal impression on the spinal cord from a small T8-9 disc protrusion.  MR LUMBAR SPINE IMPRESSION  Mild lumbar spondylosis, most notable at L4-5 where there is mild spinal stenosis.   Electronically Signed   By: Logan Bores   On: 03/22/2015 20:51   Mr Lumbar Spine Wo Contrast  03/22/2015   CLINICAL DATA:  Progressive left lower extremity weakness.  EXAM: MRI THORACIC AND LUMBAR SPINE WITHOUT CONTRAST  TECHNIQUE: Multiplanar and multiecho pulse sequences of the thoracic and lumbar spine were obtained without intravenous contrast.  COMPARISON:  None.  FINDINGS: MR THORACIC SPINE FINDINGS  There is no listhesis. Vertebral body heights are preserved. Vertebral bone marrow signal is within normal limits aside from minimal degenerative endplate changes. Thoracic spinal cord is normal in caliber and signal. There are trace bilateral pleural effusions.  T1-2:  Small left central disc protrusion without stenosis.  T2-3:  Ligamentum flavum thickening without stenosis.  T3-4:  Right facet hypertrophy without stenosis.  T4-5: Bilateral ligamentum flavum and facet hypertrophy without stenosis.  T5-6:  Negative.  T6-7: Asymmetric right facet/ligamentous hypertrophy without stenosis.  T7-8:  Mild facet hypertrophy without stenosis.  T8-9: Small central disc protrusion results in a minimal impression on the spinal cord. No stenosis.  T9-10:  Negative.  T10-11:  Focal left ligamentum flavum thickening without stenosis.  T11-12:  At most minimal disc bulging without stenosis.  T12-L1:  Negative.  MR LUMBAR SPINE FINDINGS  Vertebral alignment is normal. Vertebral body heights an intervertebral disc space heights are  preserved. Relatively diffuse disc desiccation is present. Vertebral bone marrow signal is within normal limits. Conus medullaris is normal in signal and terminates at L1. Small T2 hyperintense bilateral renal lesions likely represent cysts but are incompletely evaluated. Atheromatous changes noted in the abdominal aorta.  L1-2:  Negative.  L2-3:  Minimal disc bulging without stenosis.  L3-4: Minimal disc bulging and minimal facet hypertrophy without significant stenosis.  L4-5: Mild disc bulging and mild-to-moderate facet hypertrophy result in mild spinal stenosis, mild right lateral recess stenosis, and no significant neural foraminal stenosis.  L5-S1:  Mild-to-moderate facet hypertrophy without stenosis.  IMPRESSION: MR THORACIC SPINE IMPRESSION  Mild thoracic spondylosis without stenosis. Minimal impression on the spinal cord from a small T8-9 disc protrusion.  MR LUMBAR SPINE IMPRESSION  Mild lumbar spondylosis, most notable at L4-5 where there is mild spinal stenosis.   Electronically Signed   By: Logan Bores   On: 03/22/2015 20:51   Mr Jodene Nam Head/brain Wo Cm  03/22/2015   CLINICAL DATA:  LEFT-sided weakness, dizziness, difficulty walking, slurred speech and facial droop for 2 days. History of strokes with residual RIGHT-sided deficits. History of seizures, hyperlipidemia.  EXAM: MRI HEAD WITHOUT CONTRAST  MRA HEAD WITHOUT CONTRAST  TECHNIQUE: Multiplanar, multiecho pulse sequences of the brain and surrounding structures were obtained without intravenous contrast. Angiographic images of the head were obtained using MRA technique without contrast.  COMPARISON:  MRI of the head Mar 06, 2015 and CT head March 21, 2015 and MRA head Feb 28, 2012  FINDINGS: MRI HEAD FINDINGS  No reduced diffusion to suggest acute ischemia. No susceptibility artifact to suggest hemorrhage. LEFT frontotemporal parietal encephalomalacia/gliosis with mild ex vacuo dilatation of LEFT occipital horn/atrium. No hydrocephalus. Old RIGHT  medial thalamus lacunar infarct. Old LEFT caudate body lacunar infarcts. Mild Wallerian degeneration as evidenced by diminutive cerebral peduncle. Patchy T2 hyperintense signal exclusive of the aforementioned abnormality consistent with mild chronic small vessel ischemic disease. No midline shift, mass effect, mass lesions.  No abnormal extra-axial fluid collections. Ocular globes and orbital contents are unremarkable. Trace ethmoid mucosal thickening without paranasal sinus air-fluid levels. The mastoid air cells are well aerated. No abnormal sellar expansion. No cerebellar tonsillar ectopia. No suspicious calvarial bone marrow signal. Fatty replaced parotid glands. The patient appears edentulous.  MRA HEAD FINDINGS  Anterior circulation: Normal flow related enhancement of the included cervical, petrous, cavernous internal carotid arteries. Moderate stenosis proximal RIGHT supraclinoid internal carotid artery, progressed from prior study. Carotid termini are patent bilaterally. Normal flow related enhancement anterior cerebral arteries with patent anterior communicating artery. Normal flow related enhancement of the proximal middle cerebral arteries, paucity of distal LEFT MCA branches corresponding to old infarct.  Posterior circulation: Normal flow related enhancement of vertebral arteries, vertebrobasilar junction. Mildly ectatic basilar artery, similar to prior examination. Patent major branch vessels. Normal flow related enhancement of the bilateral posterior cerebral arteries.  No large vessel occlusion, suspicious luminal irregularity aneurysm.  IMPRESSION: MRI HEAD: No acute intracranial process, specifically no acute ischemia.  Stable chronic changes, including old large LEFT middle cerebral artery territory infarct and, old RIGHT thalamus lacunar infarct.  MRA HEAD: No acute large vessel occlusion.  Moderate stenosis RIGHT supraclinoid internal carotid artery, progressed from prior examination.    Electronically Signed   By: Elon Alas M.D.   On: 03/22/2015 04:41   Janora Norlander, DO 03/23/2015, 7:14  AM PGY-1, [place] Intern pager: [phone], text pages welcome

## 2015-03-23 NOTE — Progress Notes (Signed)
Subjective: No family members present. The patient feels like her left lower extremity is improving. She is looking forward to physical therapy.  Objective: Current vital signs: BP 153/71 mmHg  Pulse 73  Temp(Src) 97.5 F (36.4 C) (Oral)  Resp 16  Ht 6\' 5"  (1.956 m)  Wt 107.412 kg (236 lb 12.8 oz)  BMI 28.07 kg/m2  SpO2 98% Vital signs in last 24 hours: Temp:  [97.5 F (36.4 C)-99.9 F (37.7 C)] 97.5 F (36.4 C) (06/17 0546) Pulse Rate:  [69-81] 73 (06/17 0546) Resp:  [16-18] 16 (06/17 0546) BP: (115-153)/(51-71) 153/71 mmHg (06/17 0546) SpO2:  [94 %-100 %] 98 % (06/17 0546)  Intake/Output from previous day:   Intake/Output this shift:   Nutritional status: Diet regular Room service appropriate?: Yes; Fluid consistency:: Thin  Physical Exam  Neurologic Exam:  MENTAL STATUS: awake, alert, Language fluent Follows simple commands. Significant dysarthria. Slow to speak. Right facial droop. CRANIAL NERVES: pupils equal and reactive to light, visual fields full to confrontation, extraocular muscles intact, no nystagmus, facial sensation and strength symmetric, uvula midline, shoulder shrug symmetric, tongue midline, Corneal reflex,  MOTOR: Right upper extremity 3/5. Right lower extremity 3/5. Left upper extremity 5/5 Left lower extremity  4/5 SENSORY: Decreased sensation on the right to light touch COORDINATION: finger-nose-finger normal  GAIT/STATION: Deferred   Lab Results: Basic Metabolic Panel:  Recent Labs Lab 03/21/15 1536  NA 140  K 3.9  CL 108  CO2 25  GLUCOSE 126*  BUN 9  CREATININE 0.87  CALCIUM 8.9    Liver Function Tests:  Recent Labs Lab 03/21/15 1536  AST 20  ALT 20  ALKPHOS 70  BILITOT 0.2*  PROT 6.3*  ALBUMIN 3.6   No results for input(s): LIPASE, AMYLASE in the last 168 hours. No results for input(s): AMMONIA in the last 168 hours.  CBC:  Recent Labs Lab 03/21/15 1536  WBC 6.2  NEUTROABS 3.2  HGB 12.0  HCT 38.3  MCV 82.4   PLT 323    Cardiac Enzymes: No results for input(s): CKTOTAL, CKMB, CKMBINDEX, TROPONINI in the last 168 hours.  Lipid Panel: No results for input(s): CHOL, TRIG, HDL, CHOLHDL, VLDL, LDLCALC in the last 168 hours.  CBG:  Recent Labs Lab 03/22/15 0922 03/22/15 1120 03/22/15 1636 03/22/15 2131 03/23/15 0641  GLUCAP 105* 107* 97 134* 118*    Microbiology: No results found for this or any previous visit.  Coagulation Studies:  Recent Labs  03/21/15 1536  LABPROT 13.0  INR 0.96    Imaging:   Dg Chest 2 View 03/21/2015    Stable chest x-ray without evidence of acute cardiopulmonary process.      Ct Head (brain) Wo Contrast 03/21/2015    Chronic ischemic changes left MCA territory and right thalamus. No acute abnormality.      Mr Brain Wo Contrast 03/22/2015    IMPRESSION:  MRI HEAD:  No acute intracranial process, specifically no acute ischemia.  Stable chronic changes, including old large LEFT middle cerebral artery territory infarct and, old RIGHT thalamus lacunar infarct.   MRA HEAD:  No acute large vessel occlusion.  Moderate stenosis RIGHT supraclinoid internal carotid artery, progressed from prior examination.     Mr Spine Wo Contrast 03/22/2015    MR THORACIC  Spine Mild thoracic spondylosis without stenosis. Minimal impression on the spinal cord from a small T8-9 disc protrusion.    MR LUMBAR SPINE  Mild lumbar spondylosis, most notable at L4-5 where there is mild spinal stenosis.  Medications:  Scheduled: . aspirin  325 mg Oral Daily  . clopidogrel  75 mg Oral Daily  . DULoxetine  60 mg Oral QHS  . gabapentin  300 mg Oral TID  . insulin aspart  0-4 Units Subcutaneous TID WC  . montelukast  10 mg Oral q morning - 10a  . pantoprazole  40 mg Oral Q1200  . risperiDONE  0.25 mg Oral QHS  . rosuvastatin  40 mg Oral QPM  . zonisamide  200 mg Oral QHS    Assessment/Plan: MRI of the thoracic and lumbar spine as above.  Outpatient physical  therapy recommended. Dr. Leonel Ramsay to see patient.  Mikey Bussing PA-C [facility] Pager [phone] 03/23/2015, 8:14 AM  No clear lesions on MRI to cause her leg weakness. I would recommend continuing to work with physical therapy. Please call with any further questions or concerns.   Roland Rack, MD [facility] [phone]  If 7pm- 7am, please page neurology on call as listed in [place].

## 2015-03-23 NOTE — Progress Notes (Signed)
Speech Language Pathology Treatment: Dysphagia  Patient Details Name: Andrea Cortez MRN: [id number] DOB: 08/01/1956 Today's Date: 03/23/2015 Time: 0954-1002 SLP Time Calculation (min) (ACUTE ONLY): 8 min  Assessment / Plan / Recommendation Clinical Impression  Treatment focused on diet tolerance. Diagnostic po trials provided in which patient able to consume without overt indication of aspiration and with independent use of safe swallowing precautions. No f/u indicated for dysphagia as patient appears to be tolerating currently prescribed diet.    HPI Other Pertinent Information: Andrea Cortez is a 59 y.o. female presenting with L sided weakness and slurred speech. 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 Hosptialized 2 weeks ago at [facility] for CVA however MRI at that time unable to visualize new CVA.  MRI 6/16 without acute CVA. CXR negative.    Pertinent Vitals Pain Assessment: No/denies pain  SLP Plan  All goals met    Recommendations Diet recommendations: Regular;Thin liquid Liquids provided via: Cup;Straw Medication Administration: Whole meds with liquid Supervision: Patient able to self feed Compensations: Slow rate;Small sips/bites (lingual sweep to clear buccal residue) Postural Changes and/or Swallow Maneuvers: Seated upright 90 degrees              Oral Care Recommendations: Oral care BID Follow up Recommendations: Outpatient SLP (for cognitive-linguistic treatment only, no dysphagia f/u) Plan: All goals met    GO Functional Assessment Tool Used: skilled clinical judgement Functional Limitations: Swallowing Swallow Current Status (G8996): At least 1 percent but less than 20 percent impaired, limited or restricted Swallow Goal Status (G8997): At least 1 percent but less than 20 percent impaired, limited or restricted Swallow Discharge Status (G8998): At  least 20 percent but less than 40 percent impaired, limited or restricted    MA, CCC-SLP [phone]    Andrea Cortez 03/23/2015, 10:17 AM    

## 2015-03-23 NOTE — Evaluation (Signed)
Speech Language Pathology Evaluation Patient Details Name: Andrea Cortez MRN: [id number] DOB: 20-Jun-1956 Today's Date: 03/23/2015 Time: 2620-3559 SLP Time Calculation (min) (ACUTE ONLY): 24 min  Problem List:  Patient Active Problem List   Diagnosis Date Noted  . H/O: stroke   . Facial droop   . Left-sided weakness   . Leg edema, right   . Slurred speech 03/21/2015  . TIA (transient ischemic attack) 02/29/2012  . Diabetes mellitus type 2, uncontrolled 02/29/2012  . Hyperlipidemia LDL goal < 70   . History of tobacco abuse   . Stroke   . Varicose vein   . Depression   . History of seizures    Past Medical History:  Past Medical History  Diagnosis Date  . Stroke 2004 x 2, 2010 x 1    left slightly hemorrhagic left temporal and parietal infarct, new acute right medial thalamus, and left anterior pons --> had extended rehabilitation course // Continues to have residual right UE weakness  . Hyperlipidemia   . Depression     2/2 stroke in 2004  . Seizure 2004 after stroke    developed 2/2 stroke in 2004  . Tobacco abuse 1.5 PPD x 38 years    smoking since 6th grade; quit 2004  . Varicose vein     B/L LE   Past Surgical History:  Past Surgical History  Procedure Laterality Date  . Partial hysterectomy  1998/1999    uterus removed 2/2 cervical cancer  . Tonsillectomy      as a child   HPI:  Andrea Cortez is a 59 y.o. female presenting with L sided weakness and slurred speech. 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 Hosptialized 2 weeks ago at [facility] for CVA however MRI at that time unable to visualize new CVA.  MRI 6/16 without acute CVA. CXR negative.    Assessment / Plan / Recommendation Clinical Impression  Cognitive-linguistic evaluation complete. Patient presents with mild short term memory impairement and mild expressive aphasia which she reports  is baseline from previous CVA. Dysarthria, which was also reported at baseline has worsened per patient. She has excellent awareness of her deficits and intermittently compensates effectively. She was previously receiving [facility] SLP services with the intention of transitioning to OP services in [place]. No acute SLP f/u indicated but would recommend OP SLP after d/c.     SLP Assessment  All further Speech Lanaguage Pathology  needs can be addressed in the next venue of care    Follow Up Recommendations  Outpatient SLP       Pertinent Vitals/Pain Pain Assessment: No/denies pain   SLP Goals     SLP Evaluation Prior Functioning  Cognitive/Linguistic Baseline: Baseline deficits Baseline deficit details: per patient, short term memory and word finding deficits Type of Home: House  Lives With: Family Available Help at Discharge: Family   Cognition  Overall Cognitive Status: History of cognitive impairments - at baseline Arousal/Alertness: Awake/alert Orientation Level: Oriented X4 Attention: Alternating Alternating Attention: Appears intact Memory: Impaired Memory Impairment: Retrieval deficit;Decreased short term memory Decreased Short Term Memory: Verbal basic Awareness: Appears intact Problem Solving: Appears intact Safety/Judgment: Appears intact    Comprehension  Auditory Comprehension Overall Auditory Comprehension: Appears within functional limits for tasks assessed Visual Recognition/Discrimination Discrimination: Within Function Limits Reading Comprehension Reading Status: Within funtional limits    Expression Expression Primary Mode of Expression: Verbal Verbal Expression Overall  Verbal Expression: Impaired at baseline Initiation: No impairment Automatic Speech: Name Level of Generative/Spontaneous Verbalization: Conversation Repetition: No impairment Naming: Impairment Responsive: 76-100% accurate Confrontation: Within functional limits Verbal Errors: Aware of  errors Pragmatics: No impairment   Oral / Motor Oral Motor/Sensory Function Overall Oral Motor/Sensory Function: Impaired Labial ROM: Reduced right Labial Symmetry: Within Functional Limits Labial Strength: Reduced Labial Sensation: Within Functional Limits Lingual ROM: Reduced right Lingual Symmetry: Within Functional Limits Lingual Strength: Reduced Lingual Sensation: Within Functional Limits Facial ROM: Reduced right Facial Symmetry: Within Functional Limits Facial Strength: Reduced Facial Sensation: Reduced Velum: Within Functional Limits Mandible: Within Functional Limits Motor Speech Overall Motor Speech: Impaired at baseline (patient reports worse) Respiration: Within functional limits Phonation: Normal Resonance: Within functional limits Articulation: Impaired Level of Impairment: Sentence Intelligibility: Intelligibility reduced Sentence: 75-100% accurate Conversation: 75-100% accurate Motor Planning: Witnin functional limits Effective Techniques: Slow rate;Increased vocal intensity;Over-articulate   GO    Gabriel Rainwater MA, CCC-SLP [phone]  Gabriel Rainwater [name] 03/23/2015, 10:15 AM

## 2015-03-24 LAB — HEMOGLOBIN A1C
HEMOGLOBIN A1C: 6.8 % — AB (ref 4.8–5.6)
Mean Plasma Glucose: 148 mg/dL

## 2015-03-30 NOTE — Discharge Summary (Signed)
[facility] Discharge Summary  Patient name: Andrea Cortez Medical record number: [id number] Date of birth: [birth year] Age: 59 y.o. Gender: female Date of Admission: 03/21/2015  Date of Discharge: 03/23/2015 Admitting Physician: Alveda Reasons, MD  Primary Care Provider: No primary care provider on file. Consultants: neurology  Indication for Hospitalization: L sided weakness, slurred speech  Discharge Diagnoses/Problem List:  Facial droop L sided weakness Slurred speech H/o stroke  Disposition: Discharge home with daughter  Discharge Condition: Stable  Discharge Exam:  Temp: [97.5 F (36.4 C)-99.9 F (37.7 C)] 97.5 F (36.4 C) (06/17 0546) Pulse Rate: [69-81] 73 (06/17 0546) Resp: [16-18] 16 (06/17 0546) BP: (115-153)/(51-71) 153/71 mmHg (06/17 0546) SpO2: [94 %-100 %] 98 % (06/17 0546) Physical Exam: General: awake, alert, NAD, R sided facial droop (please note that previous exam was mis-documented), sitting up at bedside having breakfast Eyes: EOMI Cardiovascular: RRR, no murmurs, +2DP Respiratory: CTAB, no increased WOB MSK: 4/5 RLE/RUE, 5/5 LLE/LUE, hand grip decreased on R Neuro: no L sided pronator drift (right side not able to be assessed) or LE cerebellar deficits, hearing decreased R compared to L, L sided facial droop, decreased sensation to R, no dysmetria on L (r not assessed), follows commands, speech slurred (stable from yesterday). Psych: mood stable, patient pleasant  Brief Hospital Course:  Andrea Cortez is a 59 y.o. female that presented with L sided weakness, dysarthria . PMH is significant for h/o CVA, TIA, T2DM, seizure  In the ED, patient reported a strong history of CVA with recent stroke 2 weeks prior treated at [facility].  On admission, CT head with no acute processes.  Her exam was remarkable for R sided weakness, dysarthria, R sided facial droop with decreased sensation.  She was place on telemetry and  neurology was consulted.  MRI/MRA obtained which showed what looked to be further stenosis of R carotid artery compared to the 02/2015 study.  Neurology recommended MRI of patient's back in the setting of a seemingly insidious onset.  This was unremarkable for acute processes or etiology of patient's symptoms.  Records were obtained from [facility].  Risk stratification labs were not repeated, since they had been very recently done.  Neurology recommended continued PT and outpatient follow up as scheduled with [facility].  PT/OT/speech evaluated patient and recommended outpatient PT and SLP therapies.  Prescriptions were provided for this.  Patient was continued on her Plavix, statin, ASA and other home medications.  Patient was discharged in stable condition.  Discharge instructions and return precautions were reviewed with patient and her daughter, who voiced good understanding.  Issues for Follow Up:  1. PT/OT outpatient  Significant Procedures: none  Significant Labs and Imaging:  Dg Chest 2 View  03/21/2015   CLINICAL DATA:  59 year old female with 3 day history of cough  EXAM: CHEST  2 VIEW  COMPARISON:  Prior chest x-ray 03/06/2015  FINDINGS: Cardiac and mediastinal contours remain within normal limits. Trace atherosclerotic calcification present within the transverse aorta. Stable background bronchitic change and diffuse mild interstitial prominence. No new focal airspace consolidation, pleural effusion, pneumothorax or pulmonary edema. No acute osseous abnormality.  IMPRESSION: Stable chest x-ray without evidence of acute cardiopulmonary process.   Electronically Signed   By: Jacqulynn Cadet M.D.   On: 03/21/2015 19:33   Ct Head (brain) Wo Contrast  03/21/2015   CLINICAL DATA:  Stroke  EXAM: CT HEAD WITHOUT CONTRAST  TECHNIQUE: Contiguous axial images were obtained from the base of the skull through  the vertex without intravenous contrast.  COMPARISON:  CT head 03/05/2015  FINDINGS: Chronic  left MCA infarct is unchanged. Chronic infarct right medial thalamus unchanged  Negative for acute infarct.  Negative for hemorrhage or mass.  Ventricle size is normal.  No shift of the midline structures.  Calvarium intact.  IMPRESSION: Chronic ischemic changes left MCA territory and right thalamus. No acute abnormality.   Electronically Signed   By: Franchot Gallo M.D.   On: 03/21/2015 17:01   Mr Brain Wo Contrast  03/22/2015   CLINICAL DATA:  LEFT-sided weakness, dizziness, difficulty walking, slurred speech and facial droop for 2 days. History of strokes with residual RIGHT-sided deficits. History of seizures, hyperlipidemia.  EXAM: MRI HEAD WITHOUT CONTRAST  MRA HEAD WITHOUT CONTRAST  TECHNIQUE: Multiplanar, multiecho pulse sequences of the brain and surrounding structures were obtained without intravenous contrast. Angiographic images of the head were obtained using MRA technique without contrast.  COMPARISON:  MRI of the head Mar 06, 2015 and CT head March 21, 2015 and MRA head Feb 28, 2012  FINDINGS: MRI HEAD FINDINGS  No reduced diffusion to suggest acute ischemia. No susceptibility artifact to suggest hemorrhage. LEFT frontotemporal parietal encephalomalacia/gliosis with mild ex vacuo dilatation of LEFT occipital horn/atrium. No hydrocephalus. Old RIGHT medial thalamus lacunar infarct. Old LEFT caudate body lacunar infarcts. Mild Wallerian degeneration as evidenced by diminutive cerebral peduncle. Patchy T2 hyperintense signal exclusive of the aforementioned abnormality consistent with mild chronic small vessel ischemic disease. No midline shift, mass effect, mass lesions.  No abnormal extra-axial fluid collections. Ocular globes and orbital contents are unremarkable. Trace ethmoid mucosal thickening without paranasal sinus air-fluid levels. The mastoid air cells are well aerated. No abnormal sellar expansion. No cerebellar tonsillar ectopia. No suspicious calvarial bone marrow signal. Fatty replaced  parotid glands. The patient appears edentulous.  MRA HEAD FINDINGS  Anterior circulation: Normal flow related enhancement of the included cervical, petrous, cavernous internal carotid arteries. Moderate stenosis proximal RIGHT supraclinoid internal carotid artery, progressed from prior study. Carotid termini are patent bilaterally. Normal flow related enhancement anterior cerebral arteries with patent anterior communicating artery. Normal flow related enhancement of the proximal middle cerebral arteries, paucity of distal LEFT MCA branches corresponding to old infarct.  Posterior circulation: Normal flow related enhancement of vertebral arteries, vertebrobasilar junction. Mildly ectatic basilar artery, similar to prior examination. Patent major branch vessels. Normal flow related enhancement of the bilateral posterior cerebral arteries.  No large vessel occlusion, suspicious luminal irregularity aneurysm.  IMPRESSION: MRI HEAD: No acute intracranial process, specifically no acute ischemia.  Stable chronic changes, including old large LEFT middle cerebral artery territory infarct and, old RIGHT thalamus lacunar infarct.  MRA HEAD: No acute large vessel occlusion.  Moderate stenosis RIGHT supraclinoid internal carotid artery, progressed from prior examination.   Electronically Signed   By: Elon Alas M.D.   On: 03/22/2015 04:41   Mr Thoracic Spine Wo Contrast  03/22/2015   CLINICAL DATA:  Progressive left lower extremity weakness.  EXAM: MRI THORACIC AND LUMBAR SPINE WITHOUT CONTRAST  TECHNIQUE: Multiplanar and multiecho pulse sequences of the thoracic and lumbar spine were obtained without intravenous contrast.  COMPARISON:  None.  FINDINGS: MR THORACIC SPINE FINDINGS  There is no listhesis. Vertebral body heights are preserved. Vertebral bone marrow signal is within normal limits aside from minimal degenerative endplate changes. Thoracic spinal cord is normal in caliber and signal. There are trace  bilateral pleural effusions.  T1-2:  Small left central disc protrusion without stenosis.  T2-3:  Ligamentum flavum thickening without stenosis.  T3-4:  Right facet hypertrophy without stenosis.  T4-5: Bilateral ligamentum flavum and facet hypertrophy without stenosis.  T5-6:  Negative.  T6-7: Asymmetric right facet/ligamentous hypertrophy without stenosis.  T7-8:  Mild facet hypertrophy without stenosis.  T8-9: Small central disc protrusion results in a minimal impression on the spinal cord. No stenosis.  T9-10:  Negative.  T10-11:  Focal left ligamentum flavum thickening without stenosis.  T11-12:  At most minimal disc bulging without stenosis.  T12-L1:  Negative.  MR LUMBAR SPINE FINDINGS  Vertebral alignment is normal. Vertebral body heights an intervertebral disc space heights are preserved. Relatively diffuse disc desiccation is present. Vertebral bone marrow signal is within normal limits. Conus medullaris is normal in signal and terminates at L1. Small T2 hyperintense bilateral renal lesions likely represent cysts but are incompletely evaluated. Atheromatous changes noted in the abdominal aorta.  L1-2:  Negative.  L2-3:  Minimal disc bulging without stenosis.  L3-4: Minimal disc bulging and minimal facet hypertrophy without significant stenosis.  L4-5: Mild disc bulging and mild-to-moderate facet hypertrophy result in mild spinal stenosis, mild right lateral recess stenosis, and no significant neural foraminal stenosis.  L5-S1:  Mild-to-moderate facet hypertrophy without stenosis.  IMPRESSION: MR THORACIC SPINE IMPRESSION  Mild thoracic spondylosis without stenosis. Minimal impression on the spinal cord from a small T8-9 disc protrusion.  MR LUMBAR SPINE IMPRESSION  Mild lumbar spondylosis, most notable at L4-5 where there is mild spinal stenosis.   Electronically Signed   By: Logan Bores   On: 03/22/2015 20:51   Mr Lumbar Spine Wo Contrast  03/22/2015   CLINICAL DATA:  Progressive left lower extremity  weakness.  EXAM: MRI THORACIC AND LUMBAR SPINE WITHOUT CONTRAST  TECHNIQUE: Multiplanar and multiecho pulse sequences of the thoracic and lumbar spine were obtained without intravenous contrast.  COMPARISON:  None.  FINDINGS: MR THORACIC SPINE FINDINGS  There is no listhesis. Vertebral body heights are preserved. Vertebral bone marrow signal is within normal limits aside from minimal degenerative endplate changes. Thoracic spinal cord is normal in caliber and signal. There are trace bilateral pleural effusions.  T1-2:  Small left central disc protrusion without stenosis.  T2-3:  Ligamentum flavum thickening without stenosis.  T3-4:  Right facet hypertrophy without stenosis.  T4-5: Bilateral ligamentum flavum and facet hypertrophy without stenosis.  T5-6:  Negative.  T6-7: Asymmetric right facet/ligamentous hypertrophy without stenosis.  T7-8:  Mild facet hypertrophy without stenosis.  T8-9: Small central disc protrusion results in a minimal impression on the spinal cord. No stenosis.  T9-10:  Negative.  T10-11:  Focal left ligamentum flavum thickening without stenosis.  T11-12:  At most minimal disc bulging without stenosis.  T12-L1:  Negative.  MR LUMBAR SPINE FINDINGS  Vertebral alignment is normal. Vertebral body heights an intervertebral disc space heights are preserved. Relatively diffuse disc desiccation is present. Vertebral bone marrow signal is within normal limits. Conus medullaris is normal in signal and terminates at L1. Small T2 hyperintense bilateral renal lesions likely represent cysts but are incompletely evaluated. Atheromatous changes noted in the abdominal aorta.  L1-2:  Negative.  L2-3:  Minimal disc bulging without stenosis.  L3-4: Minimal disc bulging and minimal facet hypertrophy without significant stenosis.  L4-5: Mild disc bulging and mild-to-moderate facet hypertrophy result in mild spinal stenosis, mild right lateral recess stenosis, and no significant neural foraminal stenosis.  L5-S1:   Mild-to-moderate facet hypertrophy without stenosis.  IMPRESSION: MR THORACIC SPINE IMPRESSION  Mild thoracic spondylosis without stenosis. Minimal impression  on the spinal cord from a small T8-9 disc protrusion.  MR LUMBAR SPINE IMPRESSION  Mild lumbar spondylosis, most notable at L4-5 where there is mild spinal stenosis.   Electronically Signed   By: Logan Bores   On: 03/22/2015 20:51   Mr Jodene Nam Head/brain Wo Cm  03/22/2015   CLINICAL DATA:  LEFT-sided weakness, dizziness, difficulty walking, slurred speech and facial droop for 2 days. History of strokes with residual RIGHT-sided deficits. History of seizures, hyperlipidemia.  EXAM: MRI HEAD WITHOUT CONTRAST  MRA HEAD WITHOUT CONTRAST  TECHNIQUE: Multiplanar, multiecho pulse sequences of the brain and surrounding structures were obtained without intravenous contrast. Angiographic images of the head were obtained using MRA technique without contrast.  COMPARISON:  MRI of the head Mar 06, 2015 and CT head March 21, 2015 and MRA head Feb 28, 2012  FINDINGS: MRI HEAD FINDINGS  No reduced diffusion to suggest acute ischemia. No susceptibility artifact to suggest hemorrhage. LEFT frontotemporal parietal encephalomalacia/gliosis with mild ex vacuo dilatation of LEFT occipital horn/atrium. No hydrocephalus. Old RIGHT medial thalamus lacunar infarct. Old LEFT caudate body lacunar infarcts. Mild Wallerian degeneration as evidenced by diminutive cerebral peduncle. Patchy T2 hyperintense signal exclusive of the aforementioned abnormality consistent with mild chronic small vessel ischemic disease. No midline shift, mass effect, mass lesions.  No abnormal extra-axial fluid collections. Ocular globes and orbital contents are unremarkable. Trace ethmoid mucosal thickening without paranasal sinus air-fluid levels. The mastoid air cells are well aerated. No abnormal sellar expansion. No cerebellar tonsillar ectopia. No suspicious calvarial bone marrow signal. Fatty replaced  parotid glands. The patient appears edentulous.  MRA HEAD FINDINGS  Anterior circulation: Normal flow related enhancement of the included cervical, petrous, cavernous internal carotid arteries. Moderate stenosis proximal RIGHT supraclinoid internal carotid artery, progressed from prior study. Carotid termini are patent bilaterally. Normal flow related enhancement anterior cerebral arteries with patent anterior communicating artery. Normal flow related enhancement of the proximal middle cerebral arteries, paucity of distal LEFT MCA branches corresponding to old infarct.  Posterior circulation: Normal flow related enhancement of vertebral arteries, vertebrobasilar junction. Mildly ectatic basilar artery, similar to prior examination. Patent major branch vessels. Normal flow related enhancement of the bilateral posterior cerebral arteries.  No large vessel occlusion, suspicious luminal irregularity aneurysm.  IMPRESSION: MRI HEAD: No acute intracranial process, specifically no acute ischemia.  Stable chronic changes, including old large LEFT middle cerebral artery territory infarct and, old RIGHT thalamus lacunar infarct.  MRA HEAD: No acute large vessel occlusion.  Moderate stenosis RIGHT supraclinoid internal carotid artery, progressed from prior examination.   Electronically Signed   By: Elon Alas M.D.   On: 03/22/2015 04:41   Results/Tests Pending at Time of Discharge: none  Discharge Medications:    Medication List    STOP taking these medications        pantoprazole 40 MG tablet  Commonly known as:  PROTONIX      TAKE these medications        aspirin 325 MG EC tablet  Take 325 mg by mouth daily.     clopidogrel 75 MG tablet  Commonly known as:  PLAVIX  Take 1 tablet (75 mg total) by mouth daily.     cyclobenzaprine 10 MG tablet  Commonly known as:  FLEXERIL  Take 10 mg by mouth 3 (three) times daily as needed for muscle spasms.     DULoxetine 60 MG capsule  Commonly known as:   CYMBALTA  Take 60 mg by mouth daily.  gabapentin 300 MG capsule  Commonly known as:  NEURONTIN  Take 300 mg by mouth 3 (three) times daily.     metFORMIN 500 MG tablet  Commonly known as:  GLUCOPHAGE  Take 1 tablet (500 mg total) by mouth 2 (two) times daily with a meal. FOR DIABETES.     montelukast 10 MG tablet  Commonly known as:  SINGULAIR  Take 10 mg by mouth every morning.     naproxen sodium 220 MG tablet  Commonly known as:  ANAPROX  Take 220 mg by mouth 2 (two) times daily as needed (for pain).     risperiDONE 0.25 MG tablet  Commonly known as:  RISPERDAL  Take 0.25 mg by mouth at bedtime.     rosuvastatin 40 MG tablet  Commonly known as:  CRESTOR  Take 1 tablet (40 mg total) by mouth daily. FOR CHOLESTEROL.     tiZANidine 4 MG tablet  Commonly known as:  ZANAFLEX  Take 4 mg by mouth every 6 (six) hours as needed for muscle spasms.     zonisamide 100 MG capsule  Commonly known as:  ZONEGRAN  Take 200 mg by mouth at bedtime.        Discharge Instructions: Please refer to Patient Instructions section of EMR for full details.  Patient was counseled important signs and symptoms that should prompt return to medical care, changes in medications, dietary instructions, activity restrictions, and follow up appointments.   Follow-Up Appointments:     Follow-up Information    Follow up with primary care provider. Schedule an appointment as soon as possible for a visit in 1 week.   Why:  hospital follow up      Follow up with [facility] Neurology. Schedule an appointment as soon as possible for a visit in 1 week.   Why:  hospital follow up      Janora Norlander, DO 03/30/2015, 9:35 AM PGY-1, [place]

## 2015-10-23 ENCOUNTER — Other Ambulatory Visit: Payer: Self-pay | Admitting: Family Medicine

## 2018-10-12 DIAGNOSIS — D509 Iron deficiency anemia, unspecified: Secondary | ICD-10-CM | POA: Diagnosis not present

## 2019-01-11 DIAGNOSIS — D509 Iron deficiency anemia, unspecified: Secondary | ICD-10-CM

## 2021-02-26 ENCOUNTER — Emergency Department (HOSPITAL_COMMUNITY): Payer: Medicare HMO

## 2021-02-26 ENCOUNTER — Observation Stay (HOSPITAL_COMMUNITY)
Admission: EM | Admit: 2021-02-26 | Discharge: 2021-03-01 | Disposition: A | Discharge status: Home / Self Care | Payer: Medicare HMO | Attending: Internal Medicine | Admitting: Internal Medicine

## 2021-02-26 ENCOUNTER — Other Ambulatory Visit: Payer: Self-pay

## 2021-02-26 ENCOUNTER — Encounter (HOSPITAL_COMMUNITY): Payer: Self-pay | Admitting: Emergency Medicine

## 2021-02-26 DIAGNOSIS — Z8673 Personal history of transient ischemic attack (TIA), and cerebral infarction without residual deficits: Secondary | ICD-10-CM | POA: Diagnosis not present

## 2021-02-26 DIAGNOSIS — E119 Type 2 diabetes mellitus without complications: Secondary | ICD-10-CM | POA: Diagnosis not present

## 2021-02-26 DIAGNOSIS — E1159 Type 2 diabetes mellitus with other circulatory complications: Secondary | ICD-10-CM

## 2021-02-26 DIAGNOSIS — J449 Chronic obstructive pulmonary disease, unspecified: Secondary | ICD-10-CM | POA: Insufficient documentation

## 2021-02-26 DIAGNOSIS — I639 Cerebral infarction, unspecified: Principal | ICD-10-CM | POA: Insufficient documentation

## 2021-02-26 DIAGNOSIS — Z20822 Contact with and (suspected) exposure to covid-19: Secondary | ICD-10-CM | POA: Diagnosis not present

## 2021-02-26 DIAGNOSIS — Z7984 Long term (current) use of oral hypoglycemic drugs: Secondary | ICD-10-CM | POA: Insufficient documentation

## 2021-02-26 DIAGNOSIS — Z79899 Other long term (current) drug therapy: Secondary | ICD-10-CM | POA: Insufficient documentation

## 2021-02-26 DIAGNOSIS — E669 Obesity, unspecified: Secondary | ICD-10-CM

## 2021-02-26 DIAGNOSIS — R531 Weakness: Secondary | ICD-10-CM | POA: Diagnosis present

## 2021-02-26 DIAGNOSIS — E1169 Type 2 diabetes mellitus with other specified complication: Secondary | ICD-10-CM | POA: Diagnosis not present

## 2021-02-26 DIAGNOSIS — I10 Essential (primary) hypertension: Secondary | ICD-10-CM | POA: Diagnosis not present

## 2021-02-26 DIAGNOSIS — Z7982 Long term (current) use of aspirin: Secondary | ICD-10-CM | POA: Insufficient documentation

## 2021-02-26 DIAGNOSIS — Z87891 Personal history of nicotine dependence: Secondary | ICD-10-CM | POA: Insufficient documentation

## 2021-02-26 DIAGNOSIS — Z7902 Long term (current) use of antithrombotics/antiplatelets: Secondary | ICD-10-CM | POA: Insufficient documentation

## 2021-02-26 DIAGNOSIS — I152 Hypertension secondary to endocrine disorders: Secondary | ICD-10-CM | POA: Diagnosis not present

## 2021-02-26 DIAGNOSIS — F0390 Unspecified dementia without behavioral disturbance: Secondary | ICD-10-CM | POA: Diagnosis not present

## 2021-02-26 LAB — COMPREHENSIVE METABOLIC PANEL
ALT: 25 U/L (ref 0–44)
AST: 31 U/L (ref 15–41)
Albumin: 3.5 g/dL (ref 3.5–5.0)
Alkaline Phosphatase: 73 U/L (ref 38–126)
Anion gap: 10 (ref 5–15)
BUN: 10 mg/dL (ref 8–23)
CO2: 24 mmol/L (ref 22–32)
Calcium: 8.8 mg/dL — ABNORMAL LOW (ref 8.9–10.3)
Chloride: 106 mmol/L (ref 98–111)
Creatinine, Ser: 0.87 mg/dL (ref 0.44–1.00)
GFR, Estimated: >60 mL/min (ref >60)
Glucose, Bld: 149 mg/dL — ABNORMAL HIGH (ref 70–99)
Potassium: 3.5 mmol/L (ref 3.5–5.1)
Sodium: 140 mmol/L (ref 135–145)
Total Bilirubin: 0.6 mg/dL (ref 0.3–1.2)
Total Protein: 6.7 g/dL (ref 6.5–8.1)

## 2021-02-26 LAB — DIFFERENTIAL
Abs Immature Granulocytes: 0.05 10*3/uL (ref 0.00–0.07)
Basophils Absolute: 0.1 10*3/uL (ref 0.0–0.1)
Basophils Relative: 1 %
Eosinophils Absolute: 0.4 10*3/uL (ref 0.0–0.5)
Eosinophils Relative: 3 %
Immature Granulocytes: 1 %
Lymphocytes Relative: 18 %
Lymphs Abs: 1.9 10*3/uL (ref 0.7–4.0)
Monocytes Absolute: 0.5 10*3/uL (ref 0.1–1.0)
Monocytes Relative: 4 %
Neutro Abs: 7.9 10*3/uL — ABNORMAL HIGH (ref 1.7–7.7)
Neutrophils Relative %: 73 %

## 2021-02-26 LAB — I-STAT CHEM 8, ED
BUN: 10 mg/dL (ref 8–23)
Calcium, Ion: 1.18 mmol/L (ref 1.15–1.40)
Chloride: 104 mmol/L (ref 98–111)
Creatinine, Ser: 0.7 mg/dL (ref 0.44–1.00)
Glucose, Bld: 143 mg/dL — ABNORMAL HIGH (ref 70–99)
HCT: 38 % (ref 36.0–46.0)
Hemoglobin: 12.9 g/dL (ref 12.0–15.0)
Potassium: 3.5 mmol/L (ref 3.5–5.1)
Sodium: 141 mmol/L (ref 135–145)
TCO2: 23 mmol/L (ref 22–32)

## 2021-02-26 LAB — CBG MONITORING, ED: Glucose-Capillary: 139 mg/dL — ABNORMAL HIGH (ref 70–99)

## 2021-02-26 LAB — GLUCOSE, CAPILLARY: Glucose-Capillary: 174 mg/dL — ABNORMAL HIGH (ref 70–99)

## 2021-02-26 LAB — SARS CORONAVIRUS 2 (TAT 6-24 HRS): SARS Coronavirus 2: NEGATIVE

## 2021-02-26 LAB — CBC
HCT: 40.3 % (ref 36.0–46.0)
Hemoglobin: 12.4 g/dL (ref 12.0–15.0)
MCH: 26.7 pg (ref 26.0–34.0)
MCHC: 30.8 g/dL (ref 30.0–36.0)
MCV: 86.9 fL (ref 80.0–100.0)
Platelets: 398 10*3/uL (ref 150–400)
RBC: 4.64 MIL/uL (ref 3.87–5.11)
RDW: 14.2 % (ref 11.5–15.5)
WBC: 10.8 10*3/uL — ABNORMAL HIGH (ref 4.0–10.5)
nRBC: 0 % (ref 0.0–0.2)

## 2021-02-26 LAB — APTT: aPTT: 27 seconds (ref 24–36)

## 2021-02-26 LAB — PROTIME-INR
INR: 1 (ref 0.8–1.2)
Prothrombin Time: 13.1 seconds (ref 11.4–15.2)

## 2021-02-26 MED ORDER — ZONISAMIDE 100 MG PO CAPS
200.0000 mg | ORAL_CAPSULE | Freq: Every day | ORAL | Status: DC
  Administered 2021-02-27 – 2021-02-28 (×3): 200 mg via ORAL
  Filled 2021-02-26 (×3): qty 2

## 2021-02-26 MED ORDER — DONEPEZIL HCL 10 MG PO TABS
10.0000 mg | ORAL_TABLET | Freq: Every day | ORAL | Status: DC
  Administered 2021-02-26 – 2021-02-28 (×3): 10 mg via ORAL
  Filled 2021-02-26 (×3): qty 1

## 2021-02-26 MED ORDER — ROSUVASTATIN CALCIUM 20 MG PO TABS
40.0000 mg | ORAL_TABLET | Freq: Every evening | ORAL | Status: DC
  Administered 2021-02-26 – 2021-02-28 (×3): 40 mg via ORAL
  Filled 2021-02-26 (×3): qty 2

## 2021-02-26 MED ORDER — SODIUM CHLORIDE 0.9% FLUSH: 3.0000 mL | Freq: Once | INTRAVENOUS | Status: DC

## 2021-02-26 MED ORDER — MONTELUKAST SODIUM 10 MG PO TABS
10.0000 mg | ORAL_TABLET | Freq: Every morning | ORAL | Status: DC
  Administered 2021-02-27 – 2021-03-01 (×3): 10 mg via ORAL
  Filled 2021-02-26 (×3): qty 1

## 2021-02-26 MED ORDER — SODIUM CHLORIDE 0.9 % IV SOLN: INTRAVENOUS | Status: DC

## 2021-02-26 MED ORDER — DULOXETINE HCL 60 MG PO CPEP
60.0000 mg | ORAL_CAPSULE | Freq: Every day | ORAL | Status: DC
  Administered 2021-02-26 – 2021-03-01 (×4): 60 mg via ORAL
  Filled 2021-02-26 (×4): qty 1

## 2021-02-26 MED ORDER — STROKE: EARLY STAGES OF RECOVERY BOOK
Freq: Once | Status: DC
  Filled 2021-02-26: qty 1

## 2021-02-26 MED ORDER — SENNOSIDES-DOCUSATE SODIUM 8.6-50 MG PO TABS: 1.0000 | ORAL_TABLET | Freq: Every evening | ORAL | Status: DC | PRN

## 2021-02-26 MED ORDER — CLOPIDOGREL BISULFATE 75 MG PO TABS
75.0000 mg | ORAL_TABLET | Freq: Every day | ORAL | Status: DC
  Administered 2021-02-26 – 2021-03-01 (×4): 75 mg via ORAL
  Filled 2021-02-26 (×4): qty 1

## 2021-02-26 MED ORDER — ACETAMINOPHEN 650 MG RE SUPP: 650.0000 mg | RECTAL | Status: DC | PRN

## 2021-02-26 MED ORDER — ACETAMINOPHEN 325 MG PO TABS: 650.0000 mg | ORAL_TABLET | ORAL | Status: DC | PRN

## 2021-02-26 MED ORDER — INSULIN ASPART 100 UNIT/ML IJ SOLN
0.0000 [IU] | Freq: Three times a day (TID) | INTRAMUSCULAR | Status: DC
  Administered 2021-02-27 – 2021-02-28 (×2): 1 [IU] via SUBCUTANEOUS
  Administered 2021-02-28: 2 [IU] via SUBCUTANEOUS
  Administered 2021-02-28: 1 [IU] via SUBCUTANEOUS
  Administered 2021-03-01: 2 [IU] via SUBCUTANEOUS
  Administered 2021-03-01: 1 [IU] via SUBCUTANEOUS

## 2021-02-26 MED ORDER — RISPERIDONE 0.25 MG PO TABS
0.2500 mg | ORAL_TABLET | Freq: Every day | ORAL | Status: DC
  Administered 2021-02-26 – 2021-02-28 (×3): 0.25 mg via ORAL
  Filled 2021-02-26 (×4): qty 1

## 2021-02-26 MED ORDER — ENOXAPARIN SODIUM 40 MG/0.4ML IJ SOSY
40.0000 mg | PREFILLED_SYRINGE | INTRAMUSCULAR | Status: DC
  Administered 2021-02-26 – 2021-02-28 (×3): 40 mg via SUBCUTANEOUS
  Filled 2021-02-26 (×3): qty 0.4

## 2021-02-26 MED ORDER — ACETAMINOPHEN 160 MG/5ML PO SOLN: 650.0000 mg | ORAL | Status: DC | PRN

## 2021-02-26 MED ORDER — INSULIN ASPART 100 UNIT/ML IJ SOLN: 0.0000 [IU] | Freq: Every day | INTRAMUSCULAR | Status: DC

## 2021-02-26 MED ORDER — ASPIRIN EC 325 MG PO TBEC
325.0000 mg | DELAYED_RELEASE_TABLET | Freq: Every day | ORAL | Status: DC
  Administered 2021-02-26 – 2021-03-01 (×4): 325 mg via ORAL
  Filled 2021-02-26 (×4): qty 1

## 2021-02-26 NOTE — Progress Notes (Signed)
Pt has been admitted to the unit via hospital bed. All belongings has been sent to the room. Pt has telephone and call light in hand.

## 2021-02-26 NOTE — ED Provider Notes (Signed)
3:30 PM Care assumed from Dr. Maryan Rued while awaiting for admission to medicine service for further management of evolving stroke.  Patient reportedly was not a tPA candidate due to recent tPA several days ago but neurology recommends admission for further management and PT.  Patient reportedly still has difficulty with speech as well as left-sided weakness in extremities.  Anticipate admission shortly   Andrea Cortez, Andrea Allegra, MD 02/26/21 [phone]

## 2021-02-26 NOTE — ED Provider Notes (Signed)
[facility] EMERGENCY DEPARTMENT Provider Note   CSN: [id number] Arrival date & time: 02/26/21  1211  An emergency department physician performed an initial assessment on this suspected stroke patient at 1228.  History Chief Complaint  Patient presents with  . Weakness  . Hypertension    Andrea Cortez is a 65 y.o. female.  Patient is a 65 year old female with history of stroke, seizure, hyperlipidemia, tobacco abuse, depression, diabetes who is complaining of left-sided extremity weakness and slurred speech.  Last seen normal for this instance of deficits was around 9am this morning.  Pt does have hx of stroke with left sided deficits at [facility] last Wednesday, treated with tPA, symptoms resolved, discharged last Friday.  Family verified that patient does not have aphasia at baseline even since her recent admission to [facility] and tPA.  She has no chest pain, shortness of breath, headache or visual field changes.  She has had no nausea vomiting and has been eating normally.  Denies any choking on her food.  The history is provided by the patient and medical records.  Weakness Hypertension       Past Medical History:  Diagnosis Date  . Depression    2/2 stroke in 2004  . Hyperlipidemia   . Seizure ([place]) 2004 after stroke   developed 2/2 stroke in 2004  . Stroke ([place]) 2004 x 2, 2010 x 1   left slightly hemorrhagic left temporal and parietal infarct, new acute right medial thalamus, and left anterior pons --> had extended rehabilitation course // Continues to have residual right UE weakness  . Tobacco abuse 1.5 PPD x 38 years   smoking since 6th grade; quit 2004  . Varicose vein    B/L LE    Patient Active Problem List   Diagnosis Date Noted  . Left leg weakness   . H/O: stroke   . Facial droop   . Left-sided weakness   . Leg edema, right   . Slurred speech 03/21/2015  . TIA (transient ischemic attack) 02/29/2012  . Diabetes mellitus type 2, uncontrolled  ([place]) 02/29/2012  . Hyperlipidemia LDL goal < 70   . History of tobacco abuse   . Stroke ([place])   . Varicose vein   . Depression   . History of seizures     Past Surgical History:  Procedure Laterality Date  . PARTIAL HYSTERECTOMY  1998/1999   uterus removed 2/2 cervical cancer  . TONSILLECTOMY     as a child     OB History   No obstetric history on file.     Family History  Problem Relation Age of Onset  . Cancer Mother        lung cancer, died at 16  . Lung disease Father        1 lung removed, death (u/k cause)  . Heart attack Brother        massive MI causing death  . HIV Brother        died from AIDS  . Emphysema Brother        alive  . Other Daughter        hypochondriac    Social History   Tobacco Use  . Smoking status: Former Smoker    Packs/day: 1.50    Years: 38.00    Pack years: 57.00    Types: Cigarettes    Quit date: 01/24/2012    Years since quitting: 9.0  . Smokeless tobacco: Never Used  . Tobacco comment: started smoking  in 6th grade  Substance Use Topics  . Alcohol use: No  . Drug use: No    Home Medications Prior to Admission medications   Medication Sig Start Date End Date Taking? Authorizing Provider  aspirin 325 MG EC tablet Take 325 mg by mouth daily.    [provider]  clopidogrel (PLAVIX) 75 MG tablet Take 1 tablet (75 mg total) by mouth daily. 03/23/15   Janora Norlander, DO  cyclobenzaprine (FLEXERIL) 10 MG tablet Take 10 mg by mouth 3 (three) times daily as needed for muscle spasms.    [provider]  DULoxetine (CYMBALTA) 60 MG capsule Take 60 mg by mouth daily.    [provider]  gabapentin (NEURONTIN) 300 MG capsule Take 300 mg by mouth 3 (three) times daily. 03/15/15   [provider]  metFORMIN (GLUCOPHAGE) 500 MG tablet Take 1 tablet (500 mg total) by mouth 2 (two) times daily with a meal. FOR DIABETES. 03/23/15 04/11/18  Ronnie Doss M, DO  montelukast (SINGULAIR) 10 MG tablet Take  10 mg by mouth every morning. 03/11/15   [provider]  naproxen sodium (ANAPROX) 220 MG tablet Take 220 mg by mouth 2 (two) times daily as needed (for pain).    [provider]  risperiDONE (RISPERDAL) 0.25 MG tablet Take 0.25 mg by mouth at bedtime.    [provider]  rosuvastatin (CRESTOR) 40 MG tablet Take 1 tablet (40 mg total) by mouth daily. FOR CHOLESTEROL. Patient taking differently: Take 40 mg by mouth every evening. FOR CHOLESTEROL. 03/01/12   Kalia-Reynolds, Maitri S, DO  tiZANidine (ZANAFLEX) 4 MG tablet Take 4 mg by mouth every 6 (six) hours as needed for muscle spasms.    [provider]  zonisamide (ZONEGRAN) 100 MG capsule Take 200 mg by mouth at bedtime.     [provider]    Allergies    Ivp dye [iodinated diagnostic agents], Niacin and related, and Iron  Review of Systems   Review of Systems  Neurological: Positive for weakness.  All other systems reviewed and are negative.   Physical Exam Updated Vital Signs BP (!) 147/76   Pulse 80   Temp 98.7 F (37.1 C)   Resp (!) 22   Ht 5\' 6"  (1.676 m)   Wt 103.4 kg   SpO2 95%   BMI 36.80 kg/m   Physical Exam Vitals and nursing note reviewed.  Constitutional:      General: She is not in acute distress.    Appearance: Normal appearance. She is well-developed.  HENT:     Head: Normocephalic and atraumatic.     Mouth/Throat:     Mouth: Mucous membranes are moist.  Eyes:     Conjunctiva/sclera: Conjunctivae normal.     Pupils: Pupils are equal, round, and reactive to light.  Cardiovascular:     Rate and Rhythm: Normal rate and regular rhythm.     Heart sounds: No murmur heard.   Pulmonary:     Effort: Pulmonary effort is normal. No respiratory distress.     Breath sounds: Normal breath sounds. No wheezing or rales.  Abdominal:     General: There is no distension.     Palpations: Abdomen is soft.     Tenderness: There is no abdominal tenderness. There is no  guarding or rebound.  Musculoskeletal:        General: No tenderness. Normal range of motion.     Cervical back: Normal range of motion and neck supple.  Skin:    General: Skin is warm and dry.     Findings: No erythema or rash.  Neurological:     Mental Status: She is alert and oriented to person, place, and time.     Sensory: No sensory deficit.     Motor: Weakness present.     Gait: Gait abnormal.     Comments: Aphasia.  Pronator drift on the left upper/lower ext and 4/5 weakness.  No facial droop  Psychiatric:        Behavior: Behavior normal.      ED Results / Procedures / Treatments   Labs (all labs ordered are listed, but only abnormal results are displayed) Labs Reviewed  CBC - Abnormal; Notable for the following components:      Result Value   WBC 10.8 (*)    All other components within normal limits  DIFFERENTIAL - Abnormal; Notable for the following components:   Neutro Abs 7.9 (*)    All other components within normal limits  COMPREHENSIVE METABOLIC PANEL - Abnormal; Notable for the following components:   Glucose, Bld 149 (*)    Calcium 8.8 (*)    All other components within normal limits  I-STAT CHEM 8, ED - Abnormal; Notable for the following components:   Glucose, Bld 143 (*)    All other components within normal limits  CBG MONITORING, ED - Abnormal; Notable for the following components:   Glucose-Capillary 139 (*)    All other components within normal limits  PROTIME-INR  APTT    EKG EKG Interpretation  Date/Time:  Tuesday Feb 26 2021 13:03:35 EDT Ventricular Rate:  80 PR Interval:  164 QRS Duration: 135 QT Interval:  421 QTC Calculation: 486 R Axis:   -2 Text Interpretation: Sinus rhythm IVCD, consider atypical RBBB No significant change since last tracing Confirmed by Blanchie Dessert [phone]) on 02/26/2021 1:10:20 PM   Radiology CT HEAD CODE STROKE WO CONTRAST  Result Date: 02/26/2021 CLINICAL DATA:  Code stroke.  Weakness, slurred  speech EXAM: CT HEAD WITHOUT CONTRAST TECHNIQUE: Contiguous axial images were obtained from the base of the skull through the vertex without intravenous contrast. COMPARISON:  02/20/2021 FINDINGS: Brain: There is no acute intracranial hemorrhage, mass effect, or edema. No new loss of gray-white differentiation. Chronic infarcts of the left MCA territory are again identified. Chronic right thalamic infarct is also again noted. Stable few additional findings of probable chronic microvascular ischemic changes. Ventricles are stable in size. Vascular: No hyperdense vessel. There is intracranial atherosclerotic calcification at the skull base. Skull: Unremarkable. Sinuses/Orbits: No acute abnormality. Other: Mastoid air cells are clear. IMPRESSION: No acute intracranial hemorrhage or evidence of acute infarction. No change since 02/20/2021. These results were communicated to Dr. Leonel Ramsay at 12:55 pm on 02/26/2021 by text page via the [facility] messaging system. Electronically Signed   By: Macy Mis M.D.   On: 02/26/2021 12:58    Procedures Procedures   Medications Ordered in ED Medications  sodium chloride flush (NS) 0.9 % injection 3 mL (3 mLs Intravenous Not Given 02/26/21 1312)    ED Course  I have reviewed the triage vital signs and the nursing notes.  Pertinent labs & imaging results that were available during my care of the patient were reviewed by me and considered in my medical decision making (see chart for details).    MDM Rules/Calculators/A&P  Patient presenting today with recurrent neurologic deficits after recent stay at [facility] and receiving tPA.  Patient reported return of symptoms around 9 AM this morning was when she was last seen normal.  Per their report all of her symptoms had resolved prior to today.  She is having extremity weakness, facial droop and aphasia.  Patient is not a candidate for tPA as she has received it within the last week.  Head CT without  acute findings and given her anaphylactic reaction to contrast will go for MRI to rule out LVO.  Neurology team is at bedside.  Initial NIHSS was 7.  Blood pressure is fairly controlled at this time and vital signs are reassuring.  CBG is 139, Chem-8 without acute findings, INR normal, CBC without acute findings, CMP without acute findings.  2:57 PM Pt MRI shows evolving stroke.  She has persistent aphasia and left sided weakness. Neurology recommends admission, obs, PT/OT.  Neuro to eval meds and make recommendations.  Pt was supposed to go on better blood pressure control medication but her doctor has moved appt till next week.  MDM Number of Diagnoses or Management Options   Amount and/or Complexity of Data Reviewed Clinical lab tests: ordered and reviewed Tests in the radiology section of CPT: ordered and reviewed Tests in the medicine section of CPT: ordered and reviewed Independent visualization of images, tracings, or specimens: yes   Final Clinical Impression(s) / ED Diagnoses Final diagnoses:  Cerebrovascular accident (CVA), unspecified mechanism ([place])    Rx / [place] Orders ED Discharge Orders    None       Blanchie Dessert, MD 02/26/21 1505

## 2021-02-26 NOTE — Progress Notes (Signed)
HOSPITAL MEDICINE OVERNIGHT EVENT NOTE    Notified by nursing that patient has presented DNR paperwork.  Patient's DNR status has been confirmed with patient and family.  DNR order has therefore been placed.  Andrea Emerald  MD [facility]

## 2021-02-26 NOTE — H&P (Signed)
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  6 days prior to this admission (02/20/21) she was diagnosed with an ischemic stroke at [facility], treated with tPA, experienced symptom resolution, and was subsequently discharged 02/22/21.   Upon arrival in the ER a code stroke was called and a CT head was negative for acute findings.  A contrast allergy prevented her from being able to have a CTa.  An emergent MRI/A confirmed a right thalamic stroke but revealed no evidence of a large vessel occlusion.  She has been seen by the stroke team in the ER.  Assessment/Plan  Right thalamic infarct -recrudescence of symptoms versus extension of prior event Stroke Team following - continue aspirin and Plavix - continue Crestor - practice permissive hypertension - therapy consultations   DM2 On metformin alone at home - recent A1c 6.5 at [facility] - utilize SSI and follow CBGs  Dementia Continue Aricept 10 mg nightly  Seizure d/o Reportedly associated w/ her CVA in 2003 - continue usual home tx   COPD / tobacco abuse  Has not smoked in 9+years   HLD Continue Crestor  Obesity - Body mass index is 36.8 kg/m.    DVT prophylaxis: Lovenox Code Status: Full code Family Communication: spoke w/ daughter at bedside  Disposition Plan: Admit to acute unit Consults called: Stroke team already consulting  Review of Systems: As per HPI otherwise 10 point review of systems negative.   Past Medical History:  Diagnosis Date  . Depression    2/2 stroke in 2004  . Hyperlipidemia   . Seizure  ([place]) 2004 after stroke   developed 2/2 stroke in 2004  . Stroke ([place]) 2004 x 2, 2010 x 1   left slightly hemorrhagic left temporal and parietal infarct, new acute right medial thalamus, and left anterior pons --> had extended rehabilitation course // Continues to have residual right UE weakness  . Tobacco abuse 1.5 PPD x 38 years   smoking since 6th grade; quit 2004  . Varicose vein    B/L LE    Past Surgical History:  Procedure Laterality Date  . PARTIAL HYSTERECTOMY  1998/1999   uterus removed 2/2 cervical cancer  . TONSILLECTOMY     as a child    Family History  Family History  Problem Relation Age of Onset  . Cancer Mother        lung cancer, died at 30  . Lung disease Father        1 lung removed, death (u/k cause)  . Heart attack Brother        massive MI causing death  . HIV Brother        died from AIDS  . Emphysema Brother        alive  . Other Daughter        hypochondriac    Social History   reports that she quit smoking about 9 years ago. Her smoking use included cigarettes. She has a 57.00 pack-year smoking history. She has never used smokeless tobacco. She reports that she does not drink alcohol and does not  use drugs.  Allergies Allergies  Allergen Reactions  . Ivp Dye [Iodinated Diagnostic Agents] Shortness Of Breath  . Niacin And Related Other (See Comments)    Must take flush-free Niacin  . Iron Other (See Comments)    Unknown     Prior to Admission medications   Medication Sig Start Date End Date Taking? Authorizing Provider  aspirin 325 MG EC tablet Take 325 mg by mouth daily.    [provider]  clopidogrel (PLAVIX) 75 MG tablet Take 1 tablet (75 mg total) by mouth daily. 03/23/15   Janora Norlander, DO  cyclobenzaprine (FLEXERIL) 10 MG tablet Take 10 mg by mouth 3 (three) times daily as needed for muscle spasms.    [provider]  DULoxetine (CYMBALTA) 60 MG capsule Take 60 mg by mouth daily.    [provider]   gabapentin (NEURONTIN) 300 MG capsule Take 300 mg by mouth 3 (three) times daily. 03/15/15   [provider]  metFORMIN (GLUCOPHAGE) 500 MG tablet Take 1 tablet (500 mg total) by mouth 2 (two) times daily with a meal. FOR DIABETES. 03/23/15 04/11/18  Ronnie Doss M, DO  montelukast (SINGULAIR) 10 MG tablet Take 10 mg by mouth every morning. 03/11/15   [provider]  naproxen sodium (ANAPROX) 220 MG tablet Take 220 mg by mouth 2 (two) times daily as needed (for pain).    [provider]  risperiDONE (RISPERDAL) 0.25 MG tablet Take 0.25 mg by mouth at bedtime.    [provider]  rosuvastatin (CRESTOR) 40 MG tablet Take 1 tablet (40 mg total) by mouth daily. FOR CHOLESTEROL. Patient taking differently: Take 40 mg by mouth every evening. FOR CHOLESTEROL. 03/01/12   Kalia-Reynolds, Maitri S, DO  tiZANidine (ZANAFLEX) 4 MG tablet Take 4 mg by mouth every 6 (six) hours as needed for muscle spasms.    [provider]  zonisamide (ZONEGRAN) 100 MG capsule Take 200 mg by mouth at bedtime.     [provider]    Physical Exam: Vitals:   02/26/21 1730 02/26/21 1745 02/26/21 1800 02/26/21 1849  BP: 134/62 (!) 145/66 (!) 142/61 (!) 147/68  Pulse: 84 81 79 73  Resp: (!) 24 18 (!) 24 (!) 22  Temp:    98.3 F (36.8 C)  TempSrc:    Oral  SpO2: 98% 95% 96% 98%  Weight:      Height:        Constitutional: NAD, calm, comfortable Eyes: PERRL, lids and conjunctivae normal ENMT: Mucous membranes are moist. Neck: normal, supple Respiratory: clear to auscultation bilaterally, no wheezing, no crackles. Normal respiratory effort. No accessory muscle use.  Cardiovascular: Regular rate and rhythm, no murmurs / rubs / gallops. Trace B lower extremity edema. Abdomen: no tenderness, no masses palpated. No hepatosplenomegaly. Bowel sounds positive. Obese Musculoskeletal: no clubbing / cyanosis Skin: no rashes, lesions, ulcers. No induration Neurologic: as per  extensive Neurologist exam  Psychiatric: Normal judgment and insight. Alert and oriented x 3. Normal mood.    Labs on Admission:   CBC: Recent Labs  Lab 02/26/21 1224 02/26/21 1254  WBC 10.8*  --   NEUTROABS 7.9*  --   HGB 12.4 12.9  HCT 40.3 38.0  MCV 86.9  --   PLT 398  --    Basic Metabolic Panel: Recent Labs  Lab 02/26/21 1224 02/26/21 1254  NA 140 141  K 3.5 3.5  CL 106 104  CO2 24  --   GLUCOSE 149* 143*  BUN 10 10  CREATININE 0.87 0.70  CALCIUM 8.8*  --    GFR: Estimated Creatinine Clearance: 82.8 mL/min (by C-G formula based on SCr of 0.7 mg/dL).   Liver Function Tests: Recent Labs  Lab 02/26/21 1224  AST 31  ALT 25  ALKPHOS 73  BILITOT 0.6  PROT 6.7  ALBUMIN 3.5   Coagulation Profile: Recent Labs  Lab 02/26/21 1224  INR 1.0   CBG: Recent Labs  Lab 02/26/21 1307  GLUCAP 139*   Urine analysis:    Component Value Date/Time   COLORURINE AMBER (A) 03/21/2015 1541   APPEARANCEUR CLEAR 03/21/2015 1541   LABSPEC 1.017 03/21/2015 1541   PHURINE 6.5 03/21/2015 1541   GLUCOSEU NEGATIVE 03/21/2015 1541   HGBUR NEGATIVE 03/21/2015 1541   BILIRUBINUR NEGATIVE 03/21/2015 1541   KETONESUR NEGATIVE 03/21/2015 1541   PROTEINUR NEGATIVE 03/21/2015 1541   UROBILINOGEN 1.0 03/21/2015 1541   NITRITE NEGATIVE 03/21/2015 1541   LEUKOCYTESUR TRACE (A) 03/21/2015 1541    Radiological Exams on Admission: MR ANGIO HEAD WO CONTRAST  Addendum Date: 02/26/2021   ADDENDUM REPORT: 02/26/2021 14:37 ADDENDUM: Dr. Leonel Ramsay called to discuss the case. There could be some early restricted diffusion developing in the right basal ganglia/posterior limb internal capsule/lateral thalamus region. I do not think this is definite but could be an early real finding. Electronically Signed   By: Nelson Chimes M.D.   On: 02/26/2021 14:37   Result Date: 02/26/2021 CLINICAL DATA:  Weakness. Slurred speech. Acute stroke presentation. EXAM: MRI HEAD WITHOUT CONTRAST MRA HEAD  WITHOUT CONTRAST TECHNIQUE: Multiplanar, multi-echo pulse sequences of the brain and surrounding structures were acquired without intravenous contrast. Angiographic images of the Circle of Willis were acquired using MRA technique without intravenous contrast. COMPARISON: No pertinent prior exam. COMPARISON:  Head CT earlier same day.  MRI 03/22/2015. FINDINGS: MRI HEAD FINDINGS Brain: Diffusion imaging does not show any acute or subacute infarction. Mild chronic small-vessel ischemic change affects the pons. No focal cerebellar insult. Old lacunar infarction right thalamus. Old left middle cerebral artery territory infarction affecting the frontoparietal cortical and subcortical brain. Chronic small-vessel ischemic change of the white matter. No mass lesion, hemorrhage, hydrocephalus or extra-axial collection. Vascular: Major vessels at the base of the brain show flow. Skull and upper cervical spine: Negative Sinuses/Orbits: Clear/normal Other: None MRA HEAD FINDINGS Anterior circulation: Both internal carotid arteries widely patent through the skull base and siphon regions. On the right, the anterior and middle cerebral vessels patent. There is focal stenosis at the distal siphon estimated at 50%. More distal branch vessels do show some atherosclerotic irregularity. On the left, the proximal anterior and middle cerebral vessels are patent. There is chronic occlusion in the left inferior division. Posterior circulation: Both vertebral arteries widely patent to the basilar. No basilar stenosis. Posterior circulation branch vessels show flow. Anatomic variants: None significant. IMPRESSION: No acute or subacute infarction. Old left middle cerebral artery territory stroke. Old lacunar infarction right thalamus. Chronic small-vessel changes of the pons and hemispheric white matter. No sign of acute large or medium vessel occlusion. 50% stenosis at the distal siphon on the right. Chronically occluded left MCA inferior  division branch serving the region of chronic infarction. Some atherosclerotic irregularity of the more distal intracranial branch vessels. Electronically Signed: By: Nelson Chimes M.D. On: 02/26/2021 14:05   MR BRAIN WO CONTRAST  Addendum Date: 02/26/2021   ADDENDUM REPORT: 02/26/2021 14:37 ADDENDUM: Dr. Leonel Ramsay called to discuss the case. There could be some early restricted diffusion  developing in the right basal ganglia/posterior limb internal capsule/lateral thalamus region. I do not think this is definite but could be an early real finding. Electronically Signed   By: Nelson Chimes M.D.   On: 02/26/2021 14:37   Result Date: 02/26/2021 CLINICAL DATA:  Weakness. Slurred speech. Acute stroke presentation. EXAM: MRI HEAD WITHOUT CONTRAST MRA HEAD WITHOUT CONTRAST TECHNIQUE: Multiplanar, multi-echo pulse sequences of the brain and surrounding structures were acquired without intravenous contrast. Angiographic images of the Circle of Willis were acquired using MRA technique without intravenous contrast. COMPARISON: No pertinent prior exam. COMPARISON:  Head CT earlier same day.  MRI 03/22/2015. FINDINGS: MRI HEAD FINDINGS Brain: Diffusion imaging does not show any acute or subacute infarction. Mild chronic small-vessel ischemic change affects the pons. No focal cerebellar insult. Old lacunar infarction right thalamus. Old left middle cerebral artery territory infarction affecting the frontoparietal cortical and subcortical brain. Chronic small-vessel ischemic change of the white matter. No mass lesion, hemorrhage, hydrocephalus or extra-axial collection. Vascular: Major vessels at the base of the brain show flow. Skull and upper cervical spine: Negative Sinuses/Orbits: Clear/normal Other: None MRA HEAD FINDINGS Anterior circulation: Both internal carotid arteries widely patent through the skull base and siphon regions. On the right, the anterior and middle cerebral vessels patent. There is focal stenosis at  the distal siphon estimated at 50%. More distal branch vessels do show some atherosclerotic irregularity. On the left, the proximal anterior and middle cerebral vessels are patent. There is chronic occlusion in the left inferior division. Posterior circulation: Both vertebral arteries widely patent to the basilar. No basilar stenosis. Posterior circulation branch vessels show flow. Anatomic variants: None significant. IMPRESSION: No acute or subacute infarction. Old left middle cerebral artery territory stroke. Old lacunar infarction right thalamus. Chronic small-vessel changes of the pons and hemispheric white matter. No sign of acute large or medium vessel occlusion. 50% stenosis at the distal siphon on the right. Chronically occluded left MCA inferior division branch serving the region of chronic infarction. Some atherosclerotic irregularity of the more distal intracranial branch vessels. Electronically Signed: By: Nelson Chimes M.D. On: 02/26/2021 14:05   CT HEAD CODE STROKE WO CONTRAST  Result Date: 02/26/2021 CLINICAL DATA:  Code stroke.  Weakness, slurred speech EXAM: CT HEAD WITHOUT CONTRAST TECHNIQUE: Contiguous axial images were obtained from the base of the skull through the vertex without intravenous contrast. COMPARISON:  02/20/2021 FINDINGS: Brain: There is no acute intracranial hemorrhage, mass effect, or edema. No new loss of gray-white differentiation. Chronic infarcts of the left MCA territory are again identified. Chronic right thalamic infarct is also again noted. Stable few additional findings of probable chronic microvascular ischemic changes. Ventricles are stable in size. Vascular: No hyperdense vessel. There is intracranial atherosclerotic calcification at the skull base. Skull: Unremarkable. Sinuses/Orbits: No acute abnormality. Other: Mastoid air cells are clear. IMPRESSION: No acute intracranial hemorrhage or evidence of acute infarction. No change since 02/20/2021. These results were  communicated to Dr. Leonel Ramsay at 12:55 pm on 02/26/2021 by text page via the [facility] messaging system. Electronically Signed   By: Macy Mis M.D.   On: 02/26/2021 12:58    EKG: Independently reviewed. Sinus rhythm at 80bpm - no afib - no acute ST/T changes.  Cherene Altes, MD [facility] Office  [phone] Pager - Text Page per Amion as per below:  On-Call/Text Page:      [email or web address]  If 7PM-7AM, please contact night-coverage [email or web address] 02/26/2021, 7:04 PM

## 2021-02-26 NOTE — Progress Notes (Signed)
To MRI limited/ MRA at 1325.

## 2021-02-26 NOTE — ED Provider Notes (Signed)
Emergency Medicine Provider Triage Evaluation Note  Andrea Cortez , a 65 y.o. female  was evaluated in triage.  Pt complains of left-sided extremity weakness and slurred speech.  Last seen normal for this instance of deficits was around 9am this morning.   Patient diagnosed with stroke with left sided deficits at [facility] last Wednesday, treated with tPA, symptoms resolved, discharged last Friday.  Symptoms recurred this morning.  Review of Systems  Positive: Left-sided extremity weakness and slurred speech Negative: Falls, trauma, headache, vision changes, chest pain, shortness of breath  Physical Exam  BP (!) 152/75 (BP Location: Right Arm)   Pulse 91   Temp 98.7 F (37.1 C)   Resp 20   Ht 5\' 6"  (1.676 m)   Wt 103.4 kg   SpO2 99%   BMI 36.80 kg/m  Gen:   Awake, no distress   Resp:  Normal effort  MSK:   Moves extremities without difficulty  Other:  Slurred speech and left sided grip weakness. Patient has right sided extremity weakness from remote stroke.  Medical Decision Making  Medically screening exam initiated at 12:28 PM.  Appropriate orders placed.  Dolce [place] was informed that the remainder of the evaluation will be completed by another provider, this initial triage assessment does not replace that evaluation, and the importance of remaining in the ED until their evaluation is complete.    Spoke with EDP, Dr. Ashok Cordia, regarding this patient.  Code stroke activated.   Lorayne Bender, PA-C 02/26/21 1246    Lajean Saver, MD 02/28/21 1249

## 2021-02-26 NOTE — Code Documentation (Signed)
Stroke Response Nurse Documentation Code Documentation  Andrea Cortez is a 65 y.o. female arriving to [place] ED via Private Vehicle on 02/26/2021 with past medical hx of stroke, HLP, Seizure. Code stroke was activated by ED. Patient from  home where she was LKW yesterday and now complaining of left sided weakness, slurred speech. Patient was discharged from [facility] after receiving tPA on 5/18 for sudden onset of left sided weakness. Pt reported fully resolving to baseline (Right Sided arm and leg weakness, facial droop, and decreased right sided sensation per previous stroke) and being discharged home.  Today, pt noted the return of left sided weakness and slurred speech. Stroke team at the bedside on patient activation. Labs drawn and patient cleared for CT by EDP. Patient to CT with team. NIHSS 7, see documentation for details and code stroke times. Patient with right facial droop, bilateral arm weakness, bilateral leg weakness, right decreased sensation and dysarthria  on exam. The following imaging was completed:  CT. Patient is allergic to contrast and being taken to MRI to ruleout LVO. Patient is not a candidate for tPA due to recent stroke and receiving tPA five days ago. Care/Plan: Pending MRI. Bedside handoff with Stroke Response Nurse Andrea Cortez.    Kathrin Greathouse  Stroke Response RN

## 2021-02-26 NOTE — Consult Note (Signed)
Neurology Consultation Reason for Consult: Left-sided weakness Referring Physician: Lessie Dings  CC: Left-sided weakness  History is obtained from: Patient  HPI: Andrea Cortez is a 65 y.o. female with a history of previous stroke with right hemiparesis as well as previous seizure who presents with left hemiparesis that is worsening since yesterday.  She had a stroke and was given IV tPA on 5/18 but had recovered fairly well and then started noticing her weakness getting worse yesterday.  Today, she noticed to get significantly worse and for that reason she presented the emergency department.  She was activated as a code stroke and taken for a CT which was negative.  Given some degree of aphasia, vascular imaging was desired, but she was unfortunately allergic to contrast and had previously had what sounds like anaphylaxis with cardiac arrest(daughter states "she died twice").  She was therefore taken for an emergent MRI/MRA which demonstrates a right thalamic stroke as well as no LVO.   LKW: 5/18 tpa given?: no, outside of window   ROS: A 14 point ROS was performed and is negative except as noted in the HPI.   Past Medical History:  Diagnosis Date  . Depression    2/2 stroke in 2004  . Hyperlipidemia   . Seizure ([place]) 2004 after stroke   developed 2/2 stroke in 2004  . Stroke ([place]) 2004 x 2, 2010 x 1   left slightly hemorrhagic left temporal and parietal infarct, new acute right medial thalamus, and left anterior pons --> had extended rehabilitation course // Continues to have residual right UE weakness  . Tobacco abuse 1.5 PPD x 38 years   smoking since 6th grade; quit 2004  . Varicose vein    B/L LE     Family History  Problem Relation Age of Onset  . Cancer Mother        lung cancer, died at 55  . Lung disease Father        1 lung removed, death (u/k cause)  . Heart attack Brother        massive MI causing death  . HIV Brother        died from AIDS  . Emphysema  Brother        alive  . Other Daughter        hypochondriac     Social History:  reports that she quit smoking about 9 years ago. Her smoking use included cigarettes. She has a 57.00 pack-year smoking history. She has never used smokeless tobacco. She reports that she does not drink alcohol and does not use drugs.   Exam: Current vital signs: BP (!) 149/81   Pulse 78   Temp 98.7 F (37.1 C)   Resp (!) 21   Ht 5\' 6"  (1.676 m)   Wt 103.4 kg   SpO2 96%   BMI 36.80 kg/m  Vital signs in last 24 hours: Temp:  [98.7 F (37.1 C)] 98.7 F (37.1 C) (05/24 1217) Pulse Rate:  [78-91] 78 (05/24 1430) Resp:  [20-22] 21 (05/24 1430) BP: (142-152)/(75-86) 149/81 (05/24 1430) SpO2:  [95 %-99 %] 96 % (05/24 1430) Weight:  [103.4 kg] 103.4 kg (05/24 1226)   Physical Exam  Constitutional: Appears well-developed and well-nourished.  Psych: Affect appropriate to situation Eyes: No scleral injection HENT: No OP obstruction MSK: no joint deformities.  Cardiovascular: Normal rate and regular rhythm.  Respiratory: Effort normal, non-labored breathing GI: Soft.  No distension. There is no tenderness.  Skin: WDI  Neuro:  Mental Status: Patient is awake, alert, she has an increased latency of speech, mild expressive aphasia Cranial Nerves: II: Visual Fields are full. Pupils are equal, round, and reactive to light.   III,IV, VI: EOMI without ptosis or diploplia.  V: Facial sensation is diminished on the right VII: She has a right facial weakness VIII: hearing is intact to voice X: Uvula elevates symmetrically XI: Shoulder shrug is symmetric. XII: tongue is midline without atrophy or fasciculations.  Motor: She has 4 -/5 weakness on the right and 4+/5 weakness on the left in both the arm and leg Sensory: Sensation is diminished on the right compared to left Cerebellar: Consistent with weakness      I have reviewed labs in epic and the results pertinent to this consultation  are: Creatinine 0.8  I have reviewed the images obtained: CT/MRI/MRA- no LVO, right thalamic infarct  Impression: 65 year old female with very worsening of symptoms which I suspect are related to her recent stroke.  Fluctuation can happen, though we are slightly further out than typical, I still suspect that this is the same event.  She is currently on aspirin and Plavix and I would continue this.  Recommendations: 1) continue aspirin and Plavix 2) continue Crestor 40 mg daily 3) PT, OT, ST 4) permissive hypertension for now. 5) stroke team to follow.   Roland Rack, MD [facility] [phone]  If 7pm- 7am, please page neurology on call as listed in [place].

## 2021-02-26 NOTE — ED Notes (Signed)
PT in MRI.

## 2021-02-26 NOTE — ED Triage Notes (Addendum)
Pt arrives via POV from home with daughter that states patient has had elevated blood pressure, disoriented and generalized weakness with left arm numbness, slurred speech. Per daughter last normal 900am this morning.

## 2021-02-27 ENCOUNTER — Observation Stay (HOSPITAL_BASED_OUTPATIENT_CLINIC_OR_DEPARTMENT_OTHER): Payer: Medicare HMO

## 2021-02-27 ENCOUNTER — Inpatient Hospital Stay (HOSPITAL_COMMUNITY): Payer: Medicare HMO

## 2021-02-27 DIAGNOSIS — I6389 Other cerebral infarction: Secondary | ICD-10-CM

## 2021-02-27 DIAGNOSIS — I639 Cerebral infarction, unspecified: Secondary | ICD-10-CM | POA: Diagnosis not present

## 2021-02-27 LAB — BASIC METABOLIC PANEL
Anion gap: 9 (ref 5–15)
BUN: 10 mg/dL (ref 8–23)
CO2: 25 mmol/L (ref 22–32)
Calcium: 8.9 mg/dL (ref 8.9–10.3)
Chloride: 106 mmol/L (ref 98–111)
Creatinine, Ser: 0.81 mg/dL (ref 0.44–1.00)
GFR, Estimated: >60 mL/min (ref >60)
Glucose, Bld: 127 mg/dL — ABNORMAL HIGH (ref 70–99)
Potassium: 3.5 mmol/L (ref 3.5–5.1)
Sodium: 140 mmol/L (ref 135–145)

## 2021-02-27 LAB — ECHOCARDIOGRAM COMPLETE
AR max vel: 2.01 cm2
AV Area VTI: 2.2 cm2
AV Area mean vel: 2 cm2
AV Mean grad: 4 mmHg
AV Peak grad: 8 mmHg
Ao pk vel: 1.41 m/s
Area-P 1/2: 5.27 cm2
Height: 66 in
S' Lateral: 3.1 cm
Weight: 3648 oz

## 2021-02-27 LAB — CBC
HCT: 36.3 % (ref 36.0–46.0)
Hemoglobin: 11.2 g/dL — ABNORMAL LOW (ref 12.0–15.0)
MCH: 26.4 pg (ref 26.0–34.0)
MCHC: 30.9 g/dL (ref 30.0–36.0)
MCV: 85.6 fL (ref 80.0–100.0)
Platelets: 383 10*3/uL (ref 150–400)
RBC: 4.24 MIL/uL (ref 3.87–5.11)
RDW: 14.5 % (ref 11.5–15.5)
WBC: 8 10*3/uL (ref 4.0–10.5)
nRBC: 0 % (ref 0.0–0.2)

## 2021-02-27 LAB — GLUCOSE, CAPILLARY
Glucose-Capillary: 129 mg/dL — ABNORMAL HIGH (ref 70–99)
Glucose-Capillary: 124 mg/dL — ABNORMAL HIGH (ref 70–99)
Glucose-Capillary: 132 mg/dL — ABNORMAL HIGH (ref 70–99)
Glucose-Capillary: 122 mg/dL — ABNORMAL HIGH (ref 70–99)

## 2021-02-27 LAB — LIPID PANEL
Cholesterol: 159 mg/dL (ref 0–200)
HDL: 50 mg/dL (ref >40)
LDL Cholesterol: 88 mg/dL (ref 0–99)
Total CHOL/HDL Ratio: 3.2 RATIO
Triglycerides: 105 mg/dL (ref <150)
VLDL: 21 mg/dL (ref 0–40)

## 2021-02-27 LAB — HIV ANTIBODY (ROUTINE TESTING W REFLEX): HIV Screen 4th Generation wRfx: NONREACTIVE

## 2021-02-27 MED ORDER — MELATONIN 5 MG PO TABS
10.0000 mg | ORAL_TABLET | Freq: Every evening | ORAL | Status: DC | PRN
  Administered 2021-02-27: 10 mg via ORAL
  Filled 2021-02-27: qty 2

## 2021-02-27 NOTE — Care Management CC44 (Signed)
Condition Code 44 Documentation Completed  Patient Details  Name: Andrea Cortez MRN: [id number] Date of Birth: 1956-04-15   Condition Code 44 given:  Yes Patient signature on Condition Code 44 notice:  Yes Documentation of 2 MD's agreement:  Yes Code 44 added to claim:  Yes    Pollie Friar, RN 02/27/2021, 4:05 PM

## 2021-02-27 NOTE — Progress Notes (Signed)
  Echocardiogram 2D Echocardiogram has been performed.  Merrie Roof F 02/27/2021, 5:04 PM

## 2021-02-27 NOTE — Care Management Obs Status (Signed)
[place] NOTIFICATION   Patient Details  Name: Andrea Cortez MRN: [id number] Date of Birth: 02-Nov-1955   Medicare Observation Status Notification Given:  Yes    Pollie Friar, RN 02/27/2021, 4:05 PM
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
## 2021-02-27 NOTE — Progress Notes (Signed)
STROKE TEAM PROGRESS NOTE   INTERVAL HISTORY  65 y.o. female with a history of previous stroke with right sided residual who presents with left weakness that is worsening since Monday.  She had a stroke and was given IV tPA on 5/18 but had recovered fairly well.  She has an allergy to contrast (anaphylaxis).  MRI/MRA which demonstrates a right thalamic stroke and no LVO.  Daughter at bedside. Awake and interactive. Afebrile. VSS.  Sleep apnea study presented to patient and daughter and they would like the patient to have screening done tonight.  Research coordinator made aware.   Vitals:   02/26/21 2014 02/27/21 0010 02/27/21 0347 02/27/21 0754  BP: (!) 156/83 (!) 157/66 (!) 149/65 (!) 149/61  Pulse: 80 71 69 68  Resp: 19 20 20 15   Temp: 99 F (37.2 C) 97.9 F (36.6 C) 98.7 F (37.1 C) 97.7 F (36.5 C)  TempSrc: Oral Oral Oral Oral  SpO2: 95% 95% 96% 95%  Weight:      Height:       CBC:  Recent Labs  Lab 02/26/21 1224 02/26/21 1254 02/27/21 0349  WBC 10.8*  --  8.0  NEUTROABS 7.9*  --   --   HGB 12.4 12.9 11.2*  HCT 40.3 38.0 36.3  MCV 86.9  --  85.6  PLT 398  --  502   Basic Metabolic Panel:  Recent Labs  Lab 02/26/21 1224 02/26/21 1254 02/27/21 0349  NA 140 141 140  K 3.5 3.5 3.5  CL 106 104 106  CO2 24  --  25  GLUCOSE 149* 143* 127*  BUN 10 10 10   CREATININE 0.87 0.70 0.81  CALCIUM 8.8*  --  8.9   Lipid Panel:  Recent Labs  Lab 02/27/21 0349  CHOL 159  TRIG 105  HDL 50  CHOLHDL 3.2  VLDL 21  LDLCALC 88   HgbA1c: No results for input(s): HGBA1C in the last 168 hours. Urine Drug Screen: No results for input(s): LABOPIA, COCAINSCRNUR, LABBENZ, AMPHETMU, THCU, LABBARB in the last 168 hours.  Alcohol Level No results for input(s): ETH in the last 168 hours.  IMAGING past 24 hours MR ANGIO HEAD WO CONTRAST  Addendum Date: 02/26/2021   ADDENDUM REPORT: 02/26/2021 14:37 ADDENDUM: Dr. Leonel Ramsay called to discuss the case. There could be some early  restricted diffusion developing in the right basal ganglia/posterior limb internal capsule/lateral thalamus region. I do not think this is definite but could be an early real finding. Electronically Signed   By: Nelson Chimes M.D.   On: 02/26/2021 14:37   Result Date: 02/26/2021 IMPRESSION: No acute or subacute infarction. Old left middle cerebral artery territory stroke. Old lacunar infarction right thalamus. Chronic small-vessel changes of the pons and hemispheric white matter. No sign of acute large or medium vessel occlusion. 50% stenosis at the distal siphon on the right. Chronically occluded left MCA inferior division branch serving the region of chronic infarction. Some atherosclerotic irregularity of the more distal intracranial branch vessels  MR BRAIN WO CONTRAST  Addendum Date: 02/26/2021   ADDENDUM REPORT: 02/26/2021 14:37 ADDENDUM: Dr. Leonel Ramsay called to discuss the case. There could be some early restricted diffusion developing in the right basal ganglia/posterior limb internal capsule/lateral thalamus region. I do not think this is definite but could be an early real finding. Electronically Signed   By: Nelson Chimes M.D.   On: 02/26/2021 14:37   Result Date: 02/26/2021  IMPRESSION: No acute or subacute infarction. Old left middle cerebral  artery territory stroke. Old lacunar infarction right thalamus. Chronic small-vessel changes of the pons and hemispheric white matter. No sign of acute large or medium vessel occlusion. 50% stenosis at the distal siphon on the right. Chronically occluded left MCA inferior division branch serving the region of chronic infarction. Some atherosclerotic irregularity of the more distal intracranial branch vessels. Electronically Signed: By: Nelson Chimes M.D. On: 02/26/2021 14:05   CT HEAD CODE STROKE WO CONTRAST  Result Date: 02/26/2021 IMPRESSION: No acute intracranial hemorrhage or evidence of acute infarction. No change since 02/20/2021.   PHYSICAL  EXAM Constitutional: Obese middle-aged Caucasian lady not in distress. Psych: Affect appropriate to situation Eyes: No scleral injection HENT: No OP obstruction MSK: no joint deformities.  Cardiovascular: Normal rate and regular rhythm.  Respiratory: Effort normal, non-labored breathing GI: Soft.  No distension. There is no tenderness.  Skin: WDI  Neuro: Mental Status: Patient is awake, alert, mild expressive aphasia and nonfluent speech Cranial Nerves: II: Visual Fields are full. Pupils are equal, round, and reactive to light.   III,IV, VI: EOMI without ptosis or diploplia.  V: Facial sensation is diminished on the right VII: She has mild right lower facial weakness VIII: hearing is intact to voice X: Uvula elevates symmetrically XI: Shoulder shrug is symmetric. XII: tongue is midline without atrophy or fasciculations.  Motor: She has 4 -/5 weakness on the right (drift) and 4+/5 weakness on the left in both the arm and leg with mild right upper and lower extremity drift. Sensory: Sensation is diminished on the right compared to left  NIHSS 5 premorbid modified Rankin score 2  ASSESSMENT/PLAN [name] is a 65 y.o. female with history of left MCA stroke with right sided residual weakness, DM2, HLD, depression, dementia, COPD/tobacco abuse, and seizure disorder who presented to the ER with left sided weakness.  MRI/A showed right thalamic stroke but revealed no evidence of a large vessel occlusion.  Possible experiencing recrudescence of symptoms versus extension of prior event.    Stroke:  right thalamic infarct likely secondary to small vessel disease source.  History of remote left MCA infarct in 2004 and right thalamic infarct .  Code Stroke: CT head No acute abnormality.  MRI/A  early restricted diffusion developing in the right basal ganglia/posterior limb internal capsule/lateral thalamus region.  2D Echo pending  LDL 88  HgbA1c (5/19): 6.5  VTE  prophylaxis - lovenox  Diet: heart healthy  Aspirin 81mg  daily prior to admission, now on aspirin 81 mg daily and clopidogrel 75 mg daily.  DAPT for 3 weeks then continue Plavix alone  Therapy recommendations:  Outpatient PT  Disposition:  pending  Recent stroke  02/20/2021 pt develop acute left upper extremity weakness and dysarthria.  Initially evaluated at [place].  NIHSS 11.  CT head showed left MCA infarct. tPA was given.  She was transferred to [facility].  NIHSS improved to 4 (for new dysarthria and baseline right-sided weakness and facial droop), and full recovery of left sided weakness.  No further imaging obtained.    Stroke workup: LDL 89, HGB A1C 6.5, carotid dopplers showed no significant stenosis.  She was placed on aspirin 81mg  and rosuvastatin 5mg  daily.   Hypertension  Home meds: none  Stable . Permissive hypertension (OK if < 220/120) but gradually normalize in 5-7 days . Long-term BP goal normotensive  Hyperlipidemia  Home meds:  crestor 40, resumed in hospital  LDL 88, goal < 70  Continue statin at discharge  Diabetes type II  Controlled  Home meds:  Metformin 500mg  bid  HgbA1c 6.5 (5/18)., goal < 7.0  CBGs Recent Labs    02/26/21 1307 02/26/21 2102 02/27/21 0622  GLUCAP 139* 174* 129*      SSI  Other Stroke Risk Factors  Advanced Age >/= 53   Cigarette smoker, remote. Quit 9 years ago.  39 pack-year smoking history  Morbid Obesity, Body mass index is 36.8 kg/m., BMI >/= 30 associated with increased stroke risk, recommend weight loss, diet and exercise as appropriate   Hx stroke 2004, on Feb 20, 2021 received [facility]PA at [facility]: left slightly hemorrhagic left temporal and parietal infarct, new acute right medial thalamus, and left anterior pons --> had extended rehabilitation course // Continues to have residual right UE weakness  COPD  Other Active Problems  Seizures: takes zonegran 200 mg nightly  GERD: take  protonix  Dementia: Aricept 10mg  nightly  Hospital day # 1  Andrea Cortez, ACNP-BC Stroke NP I have personally obtained history,examined this patient, reviewed notes, independently viewed imaging studies, participated in medical decision making and plan of care.ROS completed by me personally and pertinent positives fully documented  I have made any additions or clarifications directly to the above note. Agree with note above.  Patient has a history of multiple strokes and with residual mild expressive aphasia and right main paresis presents with left-sided numbness due to new right thalamic subcortical infarct about a week ago for which she was treated at an outside hospital with tPA presents with worsening of symptoms due to evolution of her stroke without new definite stroke noted during the present admission.  Continue aspirin Plavix for 3 weeks followed by Plavix alone and aggressive risk factor modification.  Consider possible participation in the sleep smart stroke study if interested.  Patient and daughter were given information to review and decide.  He will need to come Greater and 50% time during this 35-minute visit was spent in counseling and coordination of care about the thalamic stroke and discussion about stroke prevention treatment and sleep apnea and answering questions. Antony Contras, MD Medical Director [facility] Pager: [phone] 02/27/2021 3:24 PM  To contact Stroke Continuity provider, please refer to [email or web address] After hours, contact General Neurology
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
## 2021-02-27 NOTE — Progress Notes (Signed)
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   6 days prior to this admission (02/20/21) she was diagnosed with an ischemic stroke at [facility], treated with tPA, experienced symptom resolution, and was subsequently discharged 02/22/21.   Upon arrival in the ER a code stroke was called and a CT head was negative for acute findings.  A contrast allergy prevented her from being able to have a CTA.  An emergent MRI/A confirmed a right thalamic stroke but revealed no evidence of a large vessel occlusion.  She has been seen by the stroke team in the ER.  Interval History:  No events overnight.  Daughter present bedside this afternoon when seen.  Patient still endorsing ongoing left-sided weakness which has not progressed since initial onset.  ROS: Constitutional: negative for chills and fevers, Respiratory: negative for cough, Cardiovascular: negative for chest pain and Gastrointestinal: negative for abdominal pain  Assessment & Plan:  Right thalamic CVA - does not appear to be evolution of stroke at this time - seen by neurology as well; continue aspirin and Plavix, followed by Plavix alone - Patient being enrolled in sleep study per neuro to evaluate for underlying apnea and will require remaining in the hospital another 1-2 nights - continue crestor -Okay for outpatient PT after evaluation -Follow-up echo  DM2 On metformin alone at home - recent A1c 6.5 at [facility] - utilize SSI and follow CBGs  Dementia Continue Aricept 10 mg nightly  Seizure d/o Reportedly associated w/ her CVA in 2003 - continue usual home tx    COPD / tobacco abuse  Has not smoked in 9+years - no s/s exacerbation    HLD Continue Crestor  Obesity - Body mass index is 36.8 kg/m.   Old records reviewed in assessment of this patient  Antimicrobials:   DVT prophylaxis: enoxaparin (LOVENOX) injection 40 mg Start: 02/26/21 2200   Code Status:   Code Status: DNR Family Communication: Daughter  Disposition Plan: Status is: Observation  The patient will require care spanning > 2 midnights and should be moved to inpatient because: Ongoing diagnostic testing needed not appropriate for outpatient work up and Inpatient level of care appropriate due to severity of illness  Dispo: The patient is from: Home              Anticipated d/c is to: Home              Patient currently is not medically stable to d/c.   Difficult to place patient No   Risk of unplanned readmission score: Unplanned Admission- Pilot do not use: 13.25   Objective: Blood pressure (!) 150/77, pulse 77, temperature 97.7 F (36.5 C), temperature source Oral, resp. rate 16, height 5\' 6"  (1.676 m), weight 103.4 kg, SpO2 94 %.  Examination: General appearance: alert, cooperative and no distress Head: Normocephalic, without obvious abnormality, atraumatic Eyes: EOMI Lungs: clear to auscultation bilaterally Heart: regular rate and rhythm and S1, S2 normal Abdomen: normal findings: bowel sounds normal and soft, non-tender Extremities: no edema Skin: mobility and turgor normal Neurologic: 4/5 LLE, LUE strength; dysarthria noted  Consultants:  Neurology  Procedures:     Data Reviewed: I have personally reviewed following labs and imaging studies Results for orders placed or performed during the hospital encounter of 02/26/21 (from the past 24 hour(s))  Glucose, capillary     Status: Abnormal   Collection Time: 02/26/21  9:02 PM  Result Value Ref Range   Glucose-Capillary 174 (H) 70 - 99 mg/dL  HIV Antibody (routine testing w rflx)     Status:  None   Collection Time: 02/27/21  3:49 AM  Result Value Ref Range   HIV Screen 4th Generation wRfx Non Reactive Non Reactive  Lipid panel     Status: None   Collection Time: 02/27/21  3:49 AM  Result Value Ref Range   Cholesterol 159 0 - 200 mg/dL   Triglycerides 105 <150 mg/dL   HDL 50 >40 mg/dL   Total CHOL/HDL Ratio 3.2 RATIO   VLDL 21 0 - 40 mg/dL   LDL Cholesterol 88 0 - 99 mg/dL  CBC     Status: Abnormal   Collection Time: 02/27/21  3:49 AM  Result Value Ref Range   WBC 8.0 4.0 - 10.5 K/uL   RBC 4.24 3.87 - 5.11 MIL/uL   Hemoglobin 11.2 (L) 12.0 - 15.0 g/dL   HCT 36.3 36.0 - 46.0 %   MCV 85.6 80.0 - 100.0 fL   MCH 26.4 26.0 - 34.0 pg   MCHC 30.9 30.0 - 36.0 g/dL   RDW 14.5 11.5 - 15.5 %   Platelets 383 150 - 400 K/uL   nRBC 0.0 0.0 - 0.2 %  Basic metabolic panel     Status: Abnormal   Collection Time: 02/27/21  3:49 AM  Result Value Ref Range   Sodium 140 135 - 145 mmol/L   Potassium 3.5 3.5 - 5.1 mmol/L   Chloride 106 98 - 111 mmol/L   CO2 25 22 - 32 mmol/L   Glucose, Bld 127 (H) 70 - 99 mg/dL   BUN 10 8 - 23 mg/dL   Creatinine, Ser 0.81 0.44 - 1.00 mg/dL   Calcium 8.9 8.9 - 10.3 mg/dL   GFR, Estimated >60 >60 mL/min   Anion gap 9 5 - 15  Glucose, capillary     Status: Abnormal   Collection Time: 02/27/21  6:22 AM  Result Value Ref Range   Glucose-Capillary 129 (H) 70 - 99 mg/dL  Glucose, capillary     Status: Abnormal   Collection Time: 02/27/21 12:24 PM  Result Value Ref Range   Glucose-Capillary 124 (H) 70 - 99 mg/dL    Recent Results (from the past 240 hour(s))  SARS CORONAVIRUS 2 (TAT 6-24 HRS) Nasopharyngeal Nasopharyngeal Swab     Status: None   Collection Time: 02/26/21  2:46 PM   Specimen: Nasopharyngeal Swab  Result Value Ref Range Status   SARS Coronavirus 2 NEGATIVE NEGATIVE Final    Comment: (NOTE) SARS-CoV-2 target nucleic acids are NOT DETECTED.  The SARS-CoV-2 RNA is generally detectable in upper and lower respiratory specimens during  the acute phase of infection. Negative results do not preclude SARS-CoV-2 infection, do not rule out co-infections with other pathogens, and should not be used as the sole basis for treatment or other patient management decisions. Negative results must be combined with clinical observations, patient history, and epidemiological information. The expected result is Negative.  Fact Sheet for Patients: SugarRoll.be  Fact Sheet for Healthcare Providers: https://www.woods-mathews.com/  This test is not yet approved or cleared by the [place] FDA and  has been authorized for detection and/or diagnosis of SARS-CoV-2 by FDA under an Emergency Use Authorization (EUA). This EUA will remain  in effect (meaning this test can be used) for the duration of the COVID-19 declaration under Se ction 564(b)(1) of the Act, 21 U.S.C. section 360bbb-3(b)(1), unless the authorization is terminated or revoked sooner.  Performed at [facility], [place] [address] [place], [place] [address]      Radiology Studies: MR ANGIO HEAD WO CONTRAST  Addendum Date: 02/26/2021   ADDENDUM REPORT: 02/26/2021 14:37 ADDENDUM: Dr. Leonel Ramsay called to discuss the case. There could be some early restricted diffusion developing in the right basal ganglia/posterior limb internal capsule/lateral thalamus region. I do not think this is definite but could be an early real finding. Electronically Signed   By: Nelson Chimes M.D.   On: 02/26/2021 14:37   Result Date: 02/26/2021 CLINICAL DATA:  Weakness. Slurred speech. Acute stroke presentation. EXAM: MRI HEAD WITHOUT CONTRAST MRA HEAD WITHOUT CONTRAST TECHNIQUE: Multiplanar, multi-echo pulse sequences of the brain and surrounding structures were acquired without intravenous contrast. Angiographic images of the Circle of Willis were acquired using MRA technique without intravenous contrast. COMPARISON: No pertinent prior exam. COMPARISON:   Head CT earlier same day.  MRI 03/22/2015. FINDINGS: MRI HEAD FINDINGS Brain: Diffusion imaging does not show any acute or subacute infarction. Mild chronic small-vessel ischemic change affects the pons. No focal cerebellar insult. Old lacunar infarction right thalamus. Old left middle cerebral artery territory infarction affecting the frontoparietal cortical and subcortical brain. Chronic small-vessel ischemic change of the white matter. No mass lesion, hemorrhage, hydrocephalus or extra-axial collection. Vascular: Major vessels at the base of the brain show flow. Skull and upper cervical spine: Negative Sinuses/Orbits: Clear/normal Other: None MRA HEAD FINDINGS Anterior circulation: Both internal carotid arteries widely patent through the skull base and siphon regions. On the right, the anterior and middle cerebral vessels patent. There is focal stenosis at the distal siphon estimated at 50%. More distal branch vessels do show some atherosclerotic irregularity. On the left, the proximal anterior and middle cerebral vessels are patent. There is chronic occlusion in the left inferior division. Posterior circulation: Both vertebral arteries widely patent to the basilar. No basilar stenosis. Posterior circulation branch vessels show flow. Anatomic variants: None significant. IMPRESSION: No acute or subacute infarction. Old left middle cerebral artery territory stroke. Old lacunar infarction right thalamus. Chronic small-vessel changes of the pons and hemispheric white matter. No sign of acute large or medium vessel occlusion. 50% stenosis at the distal siphon on the right. Chronically occluded left MCA inferior division branch serving the region of chronic infarction. Some atherosclerotic irregularity of the more distal intracranial branch vessels. Electronically Signed: By: Nelson Chimes M.D. On: 02/26/2021 14:05   MR BRAIN WO CONTRAST  Addendum Date: 02/26/2021   ADDENDUM REPORT: 02/26/2021 14:37 ADDENDUM: Dr.  Leonel Ramsay called to discuss the case. There could be some early restricted diffusion developing in the right basal ganglia/posterior limb internal capsule/lateral thalamus region. I do not think this is definite but could be an early real finding. Electronically Signed   By: Nelson Chimes M.D.   On: 02/26/2021 14:37   Result Date: 02/26/2021 CLINICAL DATA:  Weakness. Slurred speech. Acute stroke presentation. EXAM: MRI HEAD WITHOUT CONTRAST MRA HEAD WITHOUT CONTRAST TECHNIQUE: Multiplanar, multi-echo pulse sequences of the brain and surrounding structures were acquired without intravenous contrast. Angiographic images of the Circle of Willis were acquired using MRA technique without intravenous contrast. COMPARISON: No pertinent prior exam.  COMPARISON:  Head CT earlier same day.  MRI 03/22/2015. FINDINGS: MRI HEAD FINDINGS Brain: Diffusion imaging does not show any acute or subacute infarction. Mild chronic small-vessel ischemic change affects the pons. No focal cerebellar insult. Old lacunar infarction right thalamus. Old left middle cerebral artery territory infarction affecting the frontoparietal cortical and subcortical brain. Chronic small-vessel ischemic change of the white matter. No mass lesion, hemorrhage, hydrocephalus or extra-axial collection. Vascular: Major vessels at the base of the brain show flow. Skull and upper cervical spine: Negative Sinuses/Orbits: Clear/normal Other: None MRA HEAD FINDINGS Anterior circulation: Both internal carotid arteries widely patent through the skull base and siphon regions. On the right, the anterior and middle cerebral vessels patent. There is focal stenosis at the distal siphon estimated at 50%. More distal branch vessels do show some atherosclerotic irregularity. On the left, the proximal anterior and middle cerebral vessels are patent. There is chronic occlusion in the left inferior division. Posterior circulation: Both vertebral arteries widely patent to the  basilar. No basilar stenosis. Posterior circulation branch vessels show flow. Anatomic variants: None significant. IMPRESSION: No acute or subacute infarction. Old left middle cerebral artery territory stroke. Old lacunar infarction right thalamus. Chronic small-vessel changes of the pons and hemispheric white matter. No sign of acute large or medium vessel occlusion. 50% stenosis at the distal siphon on the right. Chronically occluded left MCA inferior division branch serving the region of chronic infarction. Some atherosclerotic irregularity of the more distal intracranial branch vessels. Electronically Signed: By: Nelson Chimes M.D. On: 02/26/2021 14:05   CT HEAD CODE STROKE WO CONTRAST  Result Date: 02/26/2021 CLINICAL DATA:  Code stroke.  Weakness, slurred speech EXAM: CT HEAD WITHOUT CONTRAST TECHNIQUE: Contiguous axial images were obtained from the base of the skull through the vertex without intravenous contrast. COMPARISON:  02/20/2021 FINDINGS: Brain: There is no acute intracranial hemorrhage, mass effect, or edema. No new loss of gray-white differentiation. Chronic infarcts of the left MCA territory are again identified. Chronic right thalamic infarct is also again noted. Stable few additional findings of probable chronic microvascular ischemic changes. Ventricles are stable in size. Vascular: No hyperdense vessel. There is intracranial atherosclerotic calcification at the skull base. Skull: Unremarkable. Sinuses/Orbits: No acute abnormality. Other: Mastoid air cells are clear. IMPRESSION: No acute intracranial hemorrhage or evidence of acute infarction. No change since 02/20/2021. These results were communicated to Dr. Leonel Ramsay at 12:55 pm on 02/26/2021 by text page via the [facility] messaging system. Electronically Signed   By: Macy Mis M.D.   On: 02/26/2021 12:58   MR BRAIN WO CONTRAST  Final Result  Addendum 1 of 1  ADDENDUM REPORT: 02/26/2021 14:37    ADDENDUM:  Dr. Leonel Ramsay called  to discuss the case. There could be some  early restricted diffusion developing in the right basal  ganglia/posterior limb internal capsule/lateral thalamus region. I  do not think this is definite but could be an early real finding.      Electronically Signed    By: Nelson Chimes M.D.    On: 02/26/2021 14:37      Final    MR ANGIO HEAD WO CONTRAST  Final Result  Addendum 1 of 1  ADDENDUM REPORT: 02/26/2021 14:37    ADDENDUM:  Dr. Leonel Ramsay called to discuss the case. There could be some  early restricted diffusion developing in the right basal  ganglia/posterior limb internal capsule/lateral thalamus region. I  do not think this is definite but could be an early real finding.  Electronically Signed    By: Nelson Chimes M.D.    On: 02/26/2021 14:37      Final    CT HEAD CODE STROKE WO CONTRAST  Final Result      Scheduled Meds: .  stroke: mapping our early stages of recovery book   Does not apply Once  . aspirin  325 mg Oral Daily  . clopidogrel  75 mg Oral Daily  . donepezil  10 mg Oral QHS  . DULoxetine  60 mg Oral Daily  . enoxaparin (LOVENOX) injection  40 mg Subcutaneous Q24H  . insulin aspart  0-5 Units Subcutaneous QHS  . insulin aspart  0-9 Units Subcutaneous TID WC  . montelukast  10 mg Oral q morning  . risperiDONE  0.25 mg Oral QHS  . rosuvastatin  40 mg Oral QPM  . sodium chloride flush  3 mL Intravenous Once  . zonisamide  200 mg Oral QHS   PRN Meds: acetaminophen **OR** acetaminophen (TYLENOL) oral liquid 160 mg/5 mL **OR** acetaminophen, melatonin, senna-docusate Continuous Infusions: . sodium chloride 50 mL/hr at 02/26/21 1953     LOS: 1 day  Time spent: Greater than 50% of the 35 minute visit was spent in counseling/coordination of care for the patient as laid out in the A&P.   Dwyane Dee, MD [facility] 02/27/2021, 3:21 PM

## 2021-02-28 DIAGNOSIS — I639 Cerebral infarction, unspecified: Secondary | ICD-10-CM | POA: Diagnosis not present

## 2021-02-28 LAB — GLUCOSE, CAPILLARY
Glucose-Capillary: 136 mg/dL — ABNORMAL HIGH (ref 70–99)
Glucose-Capillary: 143 mg/dL — ABNORMAL HIGH (ref 70–99)
Glucose-Capillary: 164 mg/dL — ABNORMAL HIGH (ref 70–99)
Glucose-Capillary: 143 mg/dL — ABNORMAL HIGH (ref 70–99)

## 2021-02-28 NOTE — Progress Notes (Signed)
STROKE TEAM PROGRESS NOTE   INTERVAL HISTORY Patient states she is doing well.  She decided to participate in the sleep smart study and underwent NOx 3 monitor for screening for sleep apnea overnight but unfortunately the readings are suboptimal the study will have to be repeated tonight. Echocardiogram shows normal ejection fraction without cardiac source of embolism. Daughter at bedside. Awake and interactive. Afebrile. VSS.    Vitals:   02/27/21 2012 02/27/21 2324 02/28/21 0345 02/28/21 1125  BP: (!) 155/76 (!) 166/77 (!) 146/74 (!) 151/73  Pulse: 79 74 78 79  Resp: 18 17 18 20   Temp: 99.2 F (37.3 C) 98.1 F (36.7 C) 97.7 F (36.5 C) 98 F (36.7 C)  TempSrc: Oral Oral Oral Oral  SpO2: 96% 94% 93% 94%  Weight:      Height:       CBC:  Recent Labs  Lab 02/26/21 1224 02/26/21 1254 02/27/21 0349  WBC 10.8*  --  8.0  NEUTROABS 7.9*  --   --   HGB 12.4 12.9 11.2*  HCT 40.3 38.0 36.3  MCV 86.9  --  85.6  PLT 398  --  099   Basic Metabolic Panel:  Recent Labs  Lab 02/26/21 1224 02/26/21 1254 02/27/21 0349  NA 140 141 140  K 3.5 3.5 3.5  CL 106 104 106  CO2 24  --  25  GLUCOSE 149* 143* 127*  BUN 10 10 10   CREATININE 0.87 0.70 0.81  CALCIUM 8.8*  --  8.9   Lipid Panel:  Recent Labs  Lab 02/27/21 0349  CHOL 159  TRIG 105  HDL 50  CHOLHDL 3.2  VLDL 21  LDLCALC 88   HgbA1c: No results for input(s): HGBA1C in the last 168 hours. Urine Drug Screen: No results for input(s): LABOPIA, COCAINSCRNUR, LABBENZ, AMPHETMU, THCU, LABBARB in the last 168 hours.  Alcohol Level No results for input(s): ETH in the last 168 hours.  IMAGING past 24 hours MR ANGIO HEAD WO CONTRAST  Addendum Date: 02/26/2021   ADDENDUM REPORT: 02/26/2021 14:37 ADDENDUM: Dr. Leonel Ramsay called to discuss the case. There could be some early restricted diffusion developing in the right basal ganglia/posterior limb internal capsule/lateral thalamus region. I do not think this is definite but could  be an early real finding. Electronically Signed   By: Nelson Chimes M.D.   On: 02/26/2021 14:37   Result Date: 02/26/2021 IMPRESSION: No acute or subacute infarction. Old left middle cerebral artery territory stroke. Old lacunar infarction right thalamus. Chronic small-vessel changes of the pons and hemispheric white matter. No sign of acute large or medium vessel occlusion. 50% stenosis at the distal siphon on the right. Chronically occluded left MCA inferior division branch serving the region of chronic infarction. Some atherosclerotic irregularity of the more distal intracranial branch vessels  MR BRAIN WO CONTRAST  Addendum Date: 02/26/2021   ADDENDUM REPORT: 02/26/2021 14:37 ADDENDUM: Dr. Leonel Ramsay called to discuss the case. There could be some early restricted diffusion developing in the right basal ganglia/posterior limb internal capsule/lateral thalamus region. I do not think this is definite but could be an early real finding. Electronically Signed   By: Nelson Chimes M.D.   On: 02/26/2021 14:37   Result Date: 02/26/2021  IMPRESSION: No acute or subacute infarction. Old left middle cerebral artery territory stroke. Old lacunar infarction right thalamus. Chronic small-vessel changes of the pons and hemispheric white matter. No sign of acute large or medium vessel occlusion. 50% stenosis at the distal siphon on  the right. Chronically occluded left MCA inferior division branch serving the region of chronic infarction. Some atherosclerotic irregularity of the more distal intracranial branch vessels. Electronically Signed: By: Nelson Chimes M.D. On: 02/26/2021 14:05   CT HEAD CODE STROKE WO CONTRAST  Result Date: 02/26/2021 IMPRESSION: No acute intracranial hemorrhage or evidence of acute infarction. No change since 02/20/2021.   PHYSICAL EXAM Constitutional: Obese middle-aged Caucasian lady not in distress. Psych: Affect appropriate to situation Eyes: No scleral injection HENT: No OP  obstruction MSK: no joint deformities.  Cardiovascular: Normal rate and regular rhythm.  Respiratory: Effort normal, non-labored breathing GI: Soft.  No distension. There is no tenderness.  Skin: WDI  Neuro: Mental Status: Patient is awake, alert, mild expressive aphasia and nonfluent speech Cranial Nerves: II: Visual Fields are full. Pupils are equal, round, and reactive to light.   III,IV, VI: EOMI without ptosis or diploplia.  V: Facial sensation is diminished on the right VII: She has mild right lower facial weakness VIII: hearing is intact to voice X: Uvula elevates symmetrically XI: Shoulder shrug is symmetric. XII: tongue is midline without atrophy or fasciculations.  Motor: She has 4 -/5 weakness on the right (drift) and 4+/5 weakness on the left in both the arm and leg with mild right upper and lower extremity drift. Sensory: Sensation is diminished on the right compared to left  NIHSS 5   ASSESSMENT/PLAN Andrea Cortez is a 65 y.o. female with history of left MCA stroke with right sided residual weakness, DM2, HLD, depression, dementia, COPD/tobacco abuse, and seizure disorder who presented to the ER with left sided weakness.  MRI/A showed right thalamic stroke but revealed no evidence of a large vessel occlusion.  Possible experiencing recrudescence of symptoms versus extension of prior event.    Stroke:  right thalamic infarct likely secondary to small vessel disease source.  History of remote left MCA infarct in 2004 and right thalamic infarct .  Code Stroke: CT head No acute abnormality.  MRI/A  early restricted diffusion developing in the right basal ganglia/posterior limb internal capsule/lateral thalamus region.  2D Echo ejection fraction 60 to 65%.  No clot.    LDL 88  HgbA1c (5/19): 6.5  VTE prophylaxis - lovenox  Diet: heart healthy  Aspirin 81mg  daily prior to admission, now on aspirin 81 mg daily and clopidogrel 75 mg daily.  DAPT for 3 weeks  then continue Plavix alone  Therapy recommendations:  Outpatient PT  Disposition:  pending  Recent stroke  02/20/2021 pt develop acute left upper extremity weakness and dysarthria.  Initially evaluated at [facility].  NIHSS 11.  CT head showed left MCA infarct. tPA was given.  She was transferred to [facility].  NIHSS improved to 4 (for new dysarthria and baseline right-sided weakness and facial droop), and full recovery of left sided weakness.  No further imaging obtained.    Stroke workup: LDL 89, HGB A1C 6.5, carotid dopplers showed no significant stenosis.  She was placed on aspirin 81mg  and rosuvastatin 5mg  daily.   Hypertension  Home meds: none  Stable . Permissive hypertension (OK if < 220/120) but gradually normalize in 5-7 days . Long-term BP goal normotensive  Hyperlipidemia  Home meds:  crestor 40, resumed in hospital  LDL 88, goal < 70  Continue statin at discharge  Diabetes type II Controlled  Home meds:  Metformin 500mg  bid  HgbA1c 6.5 (5/18)., goal < 7.0  CBGs Recent Labs    02/27/21 2125 02/28/21 0627 02/28/21 1303  GLUCAP 122* 136* 143*      SSI  Other Stroke Risk Factors  Advanced Age >/= 58   Cigarette smoker, remote. Quit 9 years ago.  76 pack-year smoking history  Morbid Obesity, Body mass index is 36.8 kg/m., BMI >/= 30 associated with increased stroke risk, recommend weight loss, diet and exercise as appropriate   Hx stroke 2004, on Feb 20, 2021 received [facility]PA at [facility]: left slightly hemorrhagic left temporal and parietal infarct, new acute right medial thalamus, and left anterior pons --> had extended rehabilitation course // Continues to have residual right UE weakness  COPD  Other Active Problems  Seizures: takes zonegran 200 mg nightly  GERD: take protonix  Dementia: Aricept 10mg  nightly  Hospital day # 1    Patient has a history of multiple strokes and with residual mild expressive aphasia and right hemi-paresis  presents with left-sided numbness due to new right thalamic subcortical infarct about a week ago for which she was treated at an outside hospital with tPA presents with worsening of symptoms due to evolution of her stroke without new definite stroke noted during the present admission.  Continue aspirin and Plavix for 3 weeks followed by Plavix alone and aggressive risk factor modification.  She is participating in the sleep smart study but NOx 3 monitor study last night was suboptimal and will be repeated tonight..  Discussed with patient, daughter and Dr. Dwyane Dee. Greater and 50% time during this 25-minute visit was spent in counseling and coordination of care about the thalamic stroke and discussion about stroke prevention treatment and sleep apnea and answering questions. Antony Contras, MD Medical Director [place] Pager: [phone] 02/28/2021 1:05 PM  To contact Stroke Continuity provider, please refer to [email or web address] After hours, contact General Neurology

## 2021-02-28 NOTE — Evaluation (Signed)
Speech Language Pathology Evaluation Patient Details Name: Andrea Cortez MRN: [id number] DOB: 1956-09-27 Today's Date: 02/28/2021 Time: 7741-2878 SLP Time Calculation (min) (ACUTE ONLY): 22 min  Problem List:  Patient Active Problem List   Diagnosis Date Noted  . Acute CVA (cerebrovascular accident) ([place]) 02/27/2021  . CVA (cerebral vascular accident) ([place]) 02/26/2021  . Left leg weakness   . H/O: stroke   . Facial droop   . Left-sided weakness   . Leg edema, right   . Slurred speech 03/21/2015  . TIA (transient ischemic attack) 02/29/2012  . Diabetes mellitus type 2, uncontrolled ([place]) 02/29/2012  . Hyperlipidemia LDL goal < 70   . History of tobacco abuse   . Stroke ([place])   . Varicose vein   . Depression   . History of seizures    Past Medical History:  Past Medical History:  Diagnosis Date  . Depression    2/2 stroke in 2004  . Hyperlipidemia   . Seizure ([place]) 2004 after stroke   developed 2/2 stroke in 2004  . Stroke ([place]) 2004 x 2, 2010 x 1   left slightly hemorrhagic left temporal and parietal infarct, new acute right medial thalamus, and left anterior pons --> had extended rehabilitation course // Continues to have residual right UE weakness  . Tobacco abuse 1.5 PPD x 38 years   smoking since 6th grade; quit 2004  . Varicose vein    B/L LE   Past Surgical History:  Past Surgical History:  Procedure Laterality Date  . PARTIAL HYSTERECTOMY  1998/1999   uterus removed 2/2 cervical cancer  . TONSILLECTOMY     as a child   HPI:  Pt is a 65 yo female presents to [facility] on 5/24 with acute onset of L sided weakness. MRI brain: R thalamic CVA. Of note, pt was admitted to [facility] 5/18 for ischemic CVA requiring tPA and symptoms resolved.  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 Pt does report worsening of speech and language skills including dysarthria of speech and anomic aphasia. PLOF pt resides at [facility]F and is independent with financial and medicine management; she no longer drives. Portions of SLUMS administered revealing mild deficits in delayed recall, thought processing, and executive function skills. Pt with slower speech and reduced articulation of sounds, though was fully intelligible with communication. Pt did have some hesitations and pauses during communication interactions due to anomic aphasia. Receptive language was fully intact. Recommend speech therapy services for worsening dysarthria and anomia aphasia. SLP to follow during acute stay.    SLP Assessment  SLP Recommendation/Assessment: Patient needs continued Speech Lanaguage Pathology Services SLP Visit Diagnosis: Dysarthria and anarthria (R47.1);Aphasia (R47.01)    Follow Up Recommendations  Outpatient SLP    Frequency and Duration min 1 x/week  1 week      SLP Evaluation Cognition  Arousal/Alertness: Awake/alert Orientation Level: Oriented X4 Attention: Focused;Sustained Memory: Impaired Memory Impairment: Decreased recall of new information;Decreased short term memory Awareness: Appears intact Problem Solving: Appears intact Executive Function: Organizing;Self Monitoring Safety/Judgment: Appears intact       Comprehension  Auditory Comprehension Overall Auditory Comprehension: Appears within functional limits for tasks assessed Visual Recognition/Discrimination Discrimination: Within Function Limits    Expression Expression Primary Mode of  Expression: Verbal Verbal Expression Overall Verbal Expression: Impaired Initiation: No impairment Naming: Impairment Pragmatics: No impairment Effective Techniques: Semantic cues;Written cues Written Expression Dominant Hand:  Left   Oral / Motor  Oral Motor/Sensory Function Overall Oral Motor/Sensory Function: Within functional limits Motor Speech Overall Motor Speech: Impaired Respiration: Impaired Phonation: Normal Resonance: Within functional limits Articulation: Impaired Level of Impairment: Word Intelligibility: Intelligible Motor Planning: Witnin functional limits Effective Techniques: Slow rate;Increased vocal intensity;Over-articulate   [place], CCC-SLP Acute Rehabilitation Services   02/28/2021, 12:06 PM

## 2021-02-28 NOTE — Progress Notes (Signed)
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   6 days prior to this admission (02/20/21) she was diagnosed with an ischemic stroke at [facility], treated with tPA, experienced symptom resolution, and was subsequently discharged 02/22/21.   Upon arrival in the ER a code stroke was called and a CT head was negative for acute findings.  A contrast allergy prevented her from being able to have a CTA.  An emergent MRI/A confirmed a right thalamic stroke but revealed no evidence of a large vessel occlusion.  She has been seen by the stroke team in the ER.  Interval History:  No events overnight.  She has been eating/swallowing okay.  Still has some dysarthria.  Left-sided strength feels about the same to her as yesterday.  She is comfortable going home at time of discharge.  Sleep study was unsuccessful last night due to oxygen sensor on finger coming off. This will be re-attempted tonight.   ROS: Constitutional: negative for chills and fevers, Respiratory: negative for cough, Cardiovascular: negative for chest pain and Gastrointestinal: negative for abdominal pain  Assessment & Plan:  Right thalamic CVA - does not appear to be evolution of stroke at this time - seen by neurology as well; continue aspirin and Plavix for 3 weeks, followed by Plavix alone - repeat sleep study tonight - continue crestor -Okay for outpatient PT after evaluation - echo reviewed; EF 60-65%, Gr 1 DD; no other worrisome features  DM2 On metformin alone at home - recent A1c 6.5 at [facility] - utilize SSI and follow CBGs  Dementia Continue Aricept 10  mg nightly  Seizure d/o Reportedly associated w/ her CVA in 2003 - continue usual home tx   COPD / tobacco abuse  Has not smoked in 9+years - no s/s exacerbation    HLD Continue Crestor  Obesity - Body mass index is 36.8 kg/m.   Old records reviewed in assessment of this patient  Antimicrobials:   DVT prophylaxis: enoxaparin (LOVENOX) injection 40 mg Start: 02/26/21 2200   Code Status:   Code Status: DNR Family Communication: Daughter  Disposition Plan: Status is: Observation  The patient will require care spanning > 2 midnights and should be moved to inpatient because: Ongoing diagnostic testing needed not appropriate for outpatient work up and Inpatient level of care appropriate due to severity of illness  Dispo: The patient is from: Home              Anticipated d/c is to: Home              Patient currently is not medically stable to d/c.   Difficult to place patient No   Risk of unplanned readmission score: Unplanned Admission- Pilot do not use: 13.25   Objective: Blood pressure (!) 151/73, pulse 79, temperature 98 F (36.7 C), temperature source Oral, resp. rate 20, height 5\' 6"  (1.676 m), weight 103.4 kg, SpO2 94 %.  Examination: General appearance: alert, cooperative and no distress Head: Normocephalic, without obvious abnormality, atraumatic Eyes: EOMI Lungs: clear to auscultation bilaterally Heart: regular rate and rhythm and S1, S2 normal Abdomen: normal findings:  bowel sounds normal and soft, non-tender Extremities: no edema Skin: mobility and turgor normal Neurologic: 4/5 LLE, LUE strength; dysarthria noted  Consultants:   Neurology  Procedures:     Data Reviewed: I have personally reviewed following labs and imaging studies Results for orders placed or performed during the hospital encounter of 02/26/21 (from the past 24 hour(s))  Glucose, capillary     Status: Abnormal   Collection Time: 02/27/21  6:20 PM  Result Value Ref Range    Glucose-Capillary 132 (H) 70 - 99 mg/dL  Glucose, capillary     Status: Abnormal   Collection Time: 02/27/21  9:25 PM  Result Value Ref Range   Glucose-Capillary 122 (H) 70 - 99 mg/dL  Glucose, capillary     Status: Abnormal   Collection Time: 02/28/21  6:27 AM  Result Value Ref Range   Glucose-Capillary 136 (H) 70 - 99 mg/dL  Glucose, capillary     Status: Abnormal   Collection Time: 02/28/21  1:03 PM  Result Value Ref Range   Glucose-Capillary 143 (H) 70 - 99 mg/dL    Recent Results (from the past 240 hour(s))  SARS CORONAVIRUS 2 (TAT 6-24 HRS) Nasopharyngeal Nasopharyngeal Swab     Status: None   Collection Time: 02/26/21  2:46 PM   Specimen: Nasopharyngeal Swab  Result Value Ref Range Status   SARS Coronavirus 2 NEGATIVE NEGATIVE Final    Comment: (NOTE) SARS-CoV-2 target nucleic acids are NOT DETECTED.  The SARS-CoV-2 RNA is generally detectable in upper and lower respiratory specimens during the acute phase of infection. Negative results do not preclude SARS-CoV-2 infection, do not rule out co-infections with other pathogens, and should not be used as the sole basis for treatment or other patient management decisions. Negative results must be combined with clinical observations, patient history, and epidemiological information. The expected result is Negative.  Fact Sheet for Patients: SugarRoll.be  Fact Sheet for Healthcare Providers: https://www.woods-mathews.com/  This test is not yet approved or cleared by the [place] FDA and  has been authorized for detection and/or diagnosis of SARS-CoV-2 by FDA under an Emergency Use Authorization (EUA). This EUA will remain  in effect (meaning this test can be used) for the duration of the COVID-19 declaration under Se ction 564(b)(1) of the Act, 21 U.S.C. section 360bbb-3(b)(1), unless the authorization is terminated or revoked sooner.  Performed at [facility],  [place] [address] [place], [place] [address]      Radiology Studies: ECHOCARDIOGRAM COMPLETE  Result Date: 02/27/2021    ECHOCARDIOGRAM REPORT   Patient Name:   [facility] Date of Exam: 02/27/2021 Medical Rec #:  [id number]      Height:       66.0 in Accession #:    [id number]     Weight:       228.0 lb Date of Birth:  [birth year]      BSA:          2.114 m Patient Age:    61 years       BP:           150/77 mmHg Patient Gender: F              HR:           77 bpm. Exam Location:  Inpatient Procedure: 2D Echo, Cardiac Doppler and Color Doppler Indications:    Stroke  History:        Patient has prior history of Echocardiogram examinations, most  recent 03/01/2012. Risk Factors:Obesity.  Sonographer:    Merrie Roof RDCS Referring Phys: [id number] IVELISSE OLIVENCIA-SIMMONS IMPRESSIONS  1. Left ventricular ejection fraction, by estimation, is 60 to 65%. The left ventricle has normal function. The left ventricle has no regional wall motion abnormalities. Left ventricular diastolic parameters are consistent with Grade I diastolic dysfunction (impaired relaxation).  2. Right ventricular systolic function is normal. The right ventricular size is normal.  3. Left atrial size was mildly dilated.  4. The mitral valve is normal in structure. No evidence of mitral valve regurgitation. No evidence of mitral stenosis.  5. The aortic valve is normal in structure. Aortic valve regurgitation is not visualized. No aortic stenosis is present.  6. The inferior vena cava is normal in size with greater than 50% respiratory variability, suggesting right atrial pressure of 3 mmHg. FINDINGS  Left Ventricle: Left ventricular ejection fraction, by estimation, is 60 to 65%. The left ventricle has normal function. The left ventricle has no regional wall motion abnormalities. The left ventricular internal cavity size was normal in size. There is  no left ventricular hypertrophy. Left ventricular diastolic parameters are consistent  with Grade I diastolic dysfunction (impaired relaxation). Indeterminate filling pressures. Right Ventricle: The right ventricular size is normal. No increase in right ventricular wall thickness. Right ventricular systolic function is normal. Left Atrium: Left atrial size was mildly dilated. Right Atrium: Right atrial size was normal in size. Pericardium: There is no evidence of pericardial effusion. Mitral Valve: The mitral valve is normal in structure. No evidence of mitral valve regurgitation. No evidence of mitral valve stenosis. Tricuspid Valve: The tricuspid valve is normal in structure. Tricuspid valve regurgitation is not demonstrated. No evidence of tricuspid stenosis. Aortic Valve: The aortic valve is normal in structure. Aortic valve regurgitation is not visualized. No aortic stenosis is present. Aortic valve mean gradient measures 4.0 mmHg. Aortic valve peak gradient measures 8.0 mmHg. Aortic valve area, by VTI measures 2.20 cm. Pulmonic Valve: The pulmonic valve was normal in structure. Pulmonic valve regurgitation is not visualized. No evidence of pulmonic stenosis. Aorta: The aortic root is normal in size and structure. Venous: The inferior vena cava is normal in size with greater than 50% respiratory variability, suggesting right atrial pressure of 3 mmHg. IAS/Shunts: No atrial level shunt detected by color flow Doppler.  LEFT VENTRICLE PLAX 2D LVIDd:         4.80 cm  Diastology LVIDs:         3.10 cm  LV e' medial:    7.72 cm/s LV PW:         1.00 cm  LV E/e' medial:  11.6 LV IVS:        1.00 cm  LV e' lateral:   7.72 cm/s LVOT diam:     2.00 cm  LV E/e' lateral: 11.6 LV SV:         70 LV SV Index:   33 LVOT Area:     3.14 cm  LEFT ATRIUM             Index LA diam:        3.90 cm 1.84 cm/m LA Vol (A2C):   68.8 ml 32.54 ml/m LA Vol (A4C):   59.2 ml 28.00 ml/m LA Biplane Vol: 69.1 ml 32.68 ml/m  AORTIC VALVE AV Area (Vmax):    2.01 cm AV Area (Vmean):   2.00 cm AV Area (VTI):     2.20 cm AV  Vmax:  141.00 cm/s AV Vmean:          97.000 cm/s AV VTI:            0.320 m AV Peak Grad:      8.0 mmHg AV Mean Grad:      4.0 mmHg LVOT Vmax:         90.20 cm/s LVOT Vmean:        61.700 cm/s LVOT VTI:          0.224 m LVOT/AV VTI ratio: 0.70  AORTA Ao Root diam: 3.50 cm Ao Asc diam:  3.50 cm MITRAL VALVE MV Area (PHT): 5.27 cm     SHUNTS MV Decel Time: 144 msec     Systemic VTI:  0.22 m MV E velocity: 89.70 cm/s   Systemic Diam: 2.00 cm MV A velocity: 106.00 cm/s MV E/A ratio:  0.85 Mihai Croitoru MD Electronically signed by Sanda Klein MD Signature Date/Time: 02/27/2021/5:38:48 PM    Final    MR BRAIN WO CONTRAST  Final Result  Addendum 1 of 1  ADDENDUM REPORT: 02/26/2021 14:37    ADDENDUM:  Dr. Leonel Ramsay called to discuss the case. There could be some  early restricted diffusion developing in the right basal  ganglia/posterior limb internal capsule/lateral thalamus region. I  do not think this is definite but could be an early real finding.      Electronically Signed    By: Nelson Chimes M.D.    On: 02/26/2021 14:37      Final    MR ANGIO HEAD WO CONTRAST  Final Result  Addendum 1 of 1  ADDENDUM REPORT: 02/26/2021 14:37    ADDENDUM:  Dr. Leonel Ramsay called to discuss the case. There could be some  early restricted diffusion developing in the right basal  ganglia/posterior limb internal capsule/lateral thalamus region. I  do not think this is definite but could be an early real finding.      Electronically Signed    By: Nelson Chimes M.D.    On: 02/26/2021 14:37      Final    CT HEAD CODE STROKE WO CONTRAST  Final Result      Scheduled Meds: .  stroke: mapping our early stages of recovery book   Does not apply Once  . aspirin  325 mg Oral Daily  . clopidogrel  75 mg Oral Daily  . donepezil  10 mg Oral QHS  . DULoxetine  60 mg Oral Daily  . enoxaparin (LOVENOX) injection  40 mg Subcutaneous Q24H  . insulin aspart  0-5 Units Subcutaneous QHS  .  insulin aspart  0-9 Units Subcutaneous TID WC  . montelukast  10 mg Oral q morning  . risperiDONE  0.25 mg Oral QHS  . rosuvastatin  40 mg Oral QPM  . sodium chloride flush  3 mL Intravenous Once  . zonisamide  200 mg Oral QHS   PRN Meds: acetaminophen **OR** acetaminophen (TYLENOL) oral liquid 160 mg/5 mL **OR** acetaminophen, melatonin, senna-docusate Continuous Infusions:    LOS: 1 day  Time spent: Greater than 50% of the 35 minute visit was spent in counseling/coordination of care for the patient as laid out in the A&P.   Dwyane Dee, MD [facility] 02/28/2021, 3:28 PM

## 2021-02-28 NOTE — Progress Notes (Signed)
Physical Therapy Treatment Patient Details Name: Andrea Cortez MRN: [id number] DOB: August 23, 1956 Today's Date: 02/28/2021    History of Present Illness Pt is a 65 yo female presents to [facility] on 5/24 with acute onset of L sided weakness. MRI brain: R thalamic CVA. Of note, pt was admitted to [facility] 5/18 for ischemic CVA requiring tPA and symptoms resolved.  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    PT Comments    Pt reports feeling "wobbly" today, but getting closer to baseline. Pt ambulatory for great hallway distance with [facility], demonstrates some unsteadiness when challenged but pt is able to correct balance without PT intervention. PT continuing to recommend OPPT post-acutely, will have daughters to assist as needed at d/c.    Follow Up Recommendations  Outpatient PT     Equipment Recommendations  None recommended by PT    Recommendations for Other Services       Precautions / Restrictions Precautions Precautions: Fall Restrictions Weight Bearing Restrictions: No    Mobility  Bed Mobility               General bed mobility comments: sitting EOB upon PT arrival    Transfers Overall transfer level: Needs assistance Equipment used: Straight cane Transfers: Sit to/from Stand Sit to Stand: Supervision         General transfer comment: for safety, repeated STS as intervention  Ambulation/Gait Ambulation/Gait assistance: Supervision;Min guard Gait Distance (Feet): 450 Feet Assistive device: Straight cane Gait Pattern/deviations: Step-through pattern;Decreased stride length;Trunk flexed;Wide base of support;Decreased dorsiflexion - right Gait velocity: decr   General Gait Details: supervision for safety on the whole, occasional close guard when PT challenging pt's dynamic balance. Verbal cuing for cane placement, sequencing, and increasing RLE clearance via hip/knee  flexion as pt with RLE circumduction.   Stairs             Wheelchair Mobility    Modified Rankin (Stroke Patients Only) Modified Rankin (Stroke Patients Only) Pre-Morbid Rankin Score: Moderate disability Modified Rankin: Moderate disability     Balance Overall balance assessment: Needs assistance;History of Falls Sitting-balance support: Feet supported;No upper extremity supported Sitting balance-Leahy Scale: Good     Standing balance support: During functional activity;Single extremity supported Standing balance-Leahy Scale: Poor Standing balance comment: reliant on cane               High Level Balance Comments: unsteadiness with step over object, directional change, but pt-corrected            Cognition Arousal/Alertness: Awake/alert Behavior During Therapy: WFL for tasks assessed/performed Overall Cognitive Status: Within Functional Limits for tasks assessed                                 General Comments: A/O x4      Exercises Other Exercises Other Exercises: STS x15, without UE support    General Comments        Pertinent Vitals/Pain Pain Assessment: Faces Faces Pain Scale: No hurt Pain Intervention(s): Monitored during session    Home Living                      Prior Function            PT Goals (current goals can now be found in the care plan section) Acute Rehab PT Goals Patient Stated Goal: go home, return  to independence PT Goal Formulation: With patient Time For Goal Achievement: 03/13/21 Potential to Achieve Goals: Good Progress towards PT goals: Progressing toward goals    Frequency    Min 4X/week      PT Plan Current plan remains appropriate    Co-evaluation              AM-PAC PT "6 Clicks" Mobility   Outcome Measure  Help needed turning from your back to your side while in a flat bed without using bedrails?: A Little Help needed moving from lying on your back to sitting on  the side of a flat bed without using bedrails?: A Little Help needed moving to and from a bed to a chair (including a wheelchair)?: A Little Help needed standing up from a chair using your arms (e.g., wheelchair or bedside chair)?: A Little Help needed to walk in hospital room?: A Little Help needed climbing 3-5 steps with a railing? : A Lot 6 Click Score: 17    End of Session Equipment Utilized During Treatment: Gait belt Activity Tolerance: Patient tolerated treatment well Patient left: in bed;with family/visitor present (pt knows to have assist with mobility, bed alarm not on upon PT arrival to room) Nurse Communication: Mobility status PT Visit Diagnosis: Other abnormalities of gait and mobility (R26.89);Difficulty in walking, not elsewhere classified (R26.2)     Time: 1572-6203 PT Time Calculation (min) (ACUTE ONLY): 27 min  Charges:  $Gait Training: 8-22 mins $Therapeutic Activity: 8-22 mins                     Stacie Glaze, PT DPT Acute Rehabilitation Services Pager [phone]  Office [phone]    [place] 02/28/2021, 4:12 PM

## 2021-03-01 DIAGNOSIS — I639 Cerebral infarction, unspecified: Secondary | ICD-10-CM | POA: Diagnosis not present

## 2021-03-01 LAB — GLUCOSE, CAPILLARY
Glucose-Capillary: 125 mg/dL — ABNORMAL HIGH (ref 70–99)
Glucose-Capillary: 170 mg/dL — ABNORMAL HIGH (ref 70–99)

## 2021-03-01 MED ORDER — ROSUVASTATIN CALCIUM 40 MG PO TABS: 40.0000 mg | ORAL_TABLET | Freq: Every evening | ORAL | 0 refills | Status: AC

## 2021-03-01 MED ORDER — AMLODIPINE BESYLATE 5 MG PO TABS: 5.0000 mg | ORAL_TABLET | Freq: Every day | ORAL | 0 refills | Status: AC

## 2021-03-01 MED ORDER — ASPIRIN EC 81 MG PO TBEC: 81.0000 mg | DELAYED_RELEASE_TABLET | Freq: Every day | ORAL | 0 refills | Status: AC

## 2021-03-01 MED ORDER — CLOPIDOGREL BISULFATE 75 MG PO TABS: 75.0000 mg | ORAL_TABLET | Freq: Every day | ORAL | 0 refills | Status: AC

## 2021-03-01 NOTE — TOC Transition Note (Signed)
Transition of Care [facility]) - CM/SW Discharge Note   Patient Details  Name: Andrea Cortez MRN: [id number] Date of Birth: 1956-05-11  Transition of Care [facility]) CM/SW Contact:  Pollie Friar, RN Phone Number: 03/01/2021, 1:49 PM   Clinical Narrative:    Patient is discharging home with outpatient therapy. Orders for [facility] faxed to [phone]. Cane for home has been delivered to the room. Pt has supervision at home and transportation to home.   Final next level of care: OP Rehab Barriers to Discharge: No Barriers Identified   Patient Goals and CMS Choice     Choice offered to / list presented to : Patient  Discharge Placement                       Discharge Plan and Services                DME Arranged: Kasandra Knudsen DME Agency: [employer] Date DME Agency Contacted: 03/01/21   Representative spoke with at DME Agency: [place] Determinants of Health ([place]) Interventions     Readmission Risk Interventions No flowsheet data found.

## 2021-03-01 NOTE — Discharge Summary (Signed)
Physician Discharge Summary  Bostyn Kunkler NLZ:[id number] DOB: 24-Aug-1956 DOA: 02/26/2021  PCP: Myrlene Broker, MD  Admit date: 02/26/2021 Discharge date: 03/01/2021  Admitted From: Home Disposition: Home  Recommendations for Outpatient Follow-up:  1. Follow up with PCP in 1-2 weeks 2. 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  6 days prior to this admission (02/20/21)she was diagnosed with an ischemicstroke at [facility], treated with tPA, experienced symptom resolution, and was subsequently discharged 02/22/21.  Upon arrival in the ER a code stroke was called and a CT head was negative for acute findings. A contrast allergy prevented her from being able to have a CTA. An emergent MRI/Aconfirmed a right thalamic stroke but revealed no evidence of a large vessel occlusion. She has been seen by the stroke team in the ER.  Followed by neurology throughout course of admission.  No imaging evidence of progression of CVA.  Neurology concerned about OSA.  Will underwent smart sleep study in house which upon discussion with neurology was negative for overt obstructive sleep apnea.  Patient medically stable for discharge at this time.  At time of discharge recommend 3 weeks of dual antiplatelet therapy aspirin and Plavix followed by Plavix monotherapy.  Ambulatory referral to neurology and physical therapy at time of discharge.  Add amlodipine for elevated blood pressures.  Follow-up with PCP in 1 week, follow-up with neurology strokelike 16 weeks.  Discharge Diagnoses:  Active  Problems:   CVA (cerebral vascular accident) ([place])   Acute CVA (cerebrovascular accident) ([place])  Right thalamic CVA - does not appear to be evolution of stroke at this time - seen by neurology as well; continue aspirin and Plavix for 3 weeks, followed by Plavix alone - Inpatient support sleep study, initial result negative for OSA Discharge plan: Continue DAPT aspirin and Plavix x3 weeks, followed by Plavix monotherapy Continue rosuvastatin, dose increased to 40 mg daily, high intensity dose.  Per documentation patient can only tolerate 5 mg.  Strongly recommend consideration for high intensity statin Outpatient PT referral Follow-up neurology stroke clinic 6 to 8 weeks - continue crestor -Okay for outpatient PT after evaluation  DM2 On metformin alone at home - recent A1c 6.5 at [facility].  Can resume metformin on discharge  Dementia Continue Aricept 10 mg nightly  Seizure d/o Reportedly associated w/ her CVA in 2003- continue usual home tx  COPD / tobacco abuse Has not smoked in 9+years - no s/s exacerbation   HLD Continue Crestor  Obesity -Body mass index is 36.8 kg/m.  Elevated blood pressure BP has been consistently elevated the patient does not have a diagnosis of hypertension.  Start amlodipine 5 mg daily at time of discharge.  1 month supply provided.  Follow-up with PCP within 1 to 2 weeks for further titration and monitoring of blood pressure.   Discharge Instructions  Discharge Instructions    Ambulatory referral to Neurology   Complete by: As directed    An appointment is requested in approximately: 6-8 weeks   Ambulatory referral to Occupational Therapy   Complete by: As directed    Ambulatory referral to Physical Therapy   Complete by: As directed    Ambulatory referral to Speech Therapy  Complete by: As directed    Diet - low sodium heart healthy   Complete by: As directed    Increase activity slowly   Complete by: As directed       Allergies as of 03/01/2021      Reactions   Ivp Dye [iodinated Diagnostic Agents] Shortness Of Breath   Niacin And Related Other (See Comments)   Must take flush-free Niacin   Iron Other (See Comments)   Unknown       Medication List    TAKE these medications   amLODipine 5 MG tablet Commonly known as: NORVASC Take 1 tablet (5 mg total) by mouth daily.   aspirin EC 81 MG tablet Take 1 tablet (81 mg total) by mouth daily for 21 days. Swallow whole.   clonazePAM 0.5 MG tablet Commonly known as: KLONOPIN Take 0.5 mg by mouth daily.   clopidogrel 75 MG tablet Commonly known as: PLAVIX Take 1 tablet (75 mg total) by mouth daily. Start taking on: Mar 02, 2021   cyclobenzaprine 10 MG tablet Commonly known as: FLEXERIL Take 10 mg by mouth at bedtime.   donepezil 10 MG tablet Commonly known as: ARICEPT Take 10 mg by mouth at bedtime.   DULoxetine 60 MG capsule Commonly known as: CYMBALTA Take 60 mg by mouth every evening.   furosemide 20 MG tablet Commonly known as: LASIX Take 20 mg by mouth daily as needed for fluid.   gabapentin 300 MG capsule Commonly known as: NEURONTIN Take 300 mg by mouth 3 (three) times daily.   Melatonin 10 MG Tabs Take 10 mg by mouth at bedtime.   metFORMIN 500 MG tablet Commonly known as: GLUCOPHAGE Take 1 tablet (500 mg total) by mouth 2 (two) times daily with a meal. FOR DIABETES.   montelukast 10 MG tablet Commonly known as: SINGULAIR Take 10 mg by mouth at bedtime.   pantoprazole 40 MG tablet Commonly known as: PROTONIX Take 40 mg by mouth daily.   risperiDONE 0.25 MG tablet Commonly known as: RISPERDAL Take 0.25 mg by mouth at bedtime.   rosuvastatin 5 MG tablet Commonly known as: CRESTOR Take 5 mg by mouth daily. What changed: Another medication with the same name was changed. Make sure you understand how and when to take each.   rosuvastatin 40 MG tablet Commonly known as: CRESTOR Take 1 tablet (40 mg total) by  mouth every evening. What changed:   when to take this  additional instructions   Vitamin D-3 125 MCG (5000 UT) Tabs Take 1 tablet by mouth daily.   zonisamide 100 MG capsule Commonly known as: ZONEGRAN Take 200 mg by mouth at bedtime.            Durable Medical Equipment  (From admission, onward)         Start     Ordered   02/27/21 1603  For home use only DME Cane  Once        02/27/21 1603          Follow-up Information    [facility] Follow up.   Why: The outpatient rehab will contact you for the first appointment Contact information: [address] [place], [place] [address]  [phone]         Myrlene Broker, MD. Schedule an appointment as soon as possible for a visit in 1 week(s).   Specialty: Family Medicine Contact information: [place]  [address] [phone]  Allergies  Allergen Reactions  . Ivp Dye [Iodinated Diagnostic Agents] Shortness Of Breath  . Niacin And Related Other (See Comments)    Must take flush-free Niacin  . Iron Other (See Comments)    Unknown     Consultations:  Neurology   Procedures/Studies: MR ANGIO HEAD WO CONTRAST  Addendum Date: 02/26/2021   ADDENDUM REPORT: 02/26/2021 14:37 ADDENDUM: Dr. Leonel Ramsay called to discuss the case. There could be some early restricted diffusion developing in the right basal ganglia/posterior limb internal capsule/lateral thalamus region. I do not think this is definite but could be an early real finding. Electronically Signed   By: Nelson Chimes M.D.   On: 02/26/2021 14:37   Result Date: 02/26/2021 CLINICAL DATA:  Weakness. Slurred speech. Acute stroke presentation. EXAM: MRI HEAD WITHOUT CONTRAST MRA HEAD WITHOUT CONTRAST TECHNIQUE: Multiplanar, multi-echo pulse sequences of the brain and surrounding structures were acquired without intravenous contrast. Angiographic images of the Circle of Willis were acquired using MRA  technique without intravenous contrast. COMPARISON: No pertinent prior exam. COMPARISON:  Head CT earlier same day.  MRI 03/22/2015. FINDINGS: MRI HEAD FINDINGS Brain: Diffusion imaging does not show any acute or subacute infarction. Mild chronic small-vessel ischemic change affects the pons. No focal cerebellar insult. Old lacunar infarction right thalamus. Old left middle cerebral artery territory infarction affecting the frontoparietal cortical and subcortical brain. Chronic small-vessel ischemic change of the white matter. No mass lesion, hemorrhage, hydrocephalus or extra-axial collection. Vascular: Major vessels at the base of the brain show flow. Skull and upper cervical spine: Negative Sinuses/Orbits: Clear/normal Other: None MRA HEAD FINDINGS Anterior circulation: Both internal carotid arteries widely patent through the skull base and siphon regions. On the right, the anterior and middle cerebral vessels patent. There is focal stenosis at the distal siphon estimated at 50%. More distal branch vessels do show some atherosclerotic irregularity. On the left, the proximal anterior and middle cerebral vessels are patent. There is chronic occlusion in the left inferior division. Posterior circulation: Both vertebral arteries widely patent to the basilar. No basilar stenosis. Posterior circulation branch vessels show flow. Anatomic variants: None significant. IMPRESSION: No acute or subacute infarction. Old left middle cerebral artery territory stroke. Old lacunar infarction right thalamus. Chronic small-vessel changes of the pons and hemispheric white matter. No sign of acute large or medium vessel occlusion. 50% stenosis at the distal siphon on the right. Chronically occluded left MCA inferior division branch serving the region of chronic infarction. Some atherosclerotic irregularity of the more distal intracranial branch vessels. Electronically Signed: By: Nelson Chimes M.D. On: 02/26/2021 14:05   MR BRAIN WO  CONTRAST  Addendum Date: 02/26/2021   ADDENDUM REPORT: 02/26/2021 14:37 ADDENDUM: Dr. Leonel Ramsay called to discuss the case. There could be some early restricted diffusion developing in the right basal ganglia/posterior limb internal capsule/lateral thalamus region. I do not think this is definite but could be an early real finding. Electronically Signed   By: Nelson Chimes M.D.   On: 02/26/2021 14:37   Result Date: 02/26/2021 CLINICAL DATA:  Weakness. Slurred speech. Acute stroke presentation. EXAM: MRI HEAD WITHOUT CONTRAST MRA HEAD WITHOUT CONTRAST TECHNIQUE: Multiplanar, multi-echo pulse sequences of the brain and surrounding structures were acquired without intravenous contrast. Angiographic images of the Circle of Willis were acquired using MRA technique without intravenous contrast. COMPARISON: No pertinent prior exam. COMPARISON:  Head CT earlier same day.  MRI 03/22/2015. FINDINGS: MRI HEAD FINDINGS Brain: Diffusion imaging does not show any acute or subacute infarction. Mild chronic small-vessel  ischemic change affects the pons. No focal cerebellar insult. Old lacunar infarction right thalamus. Old left middle cerebral artery territory infarction affecting the frontoparietal cortical and subcortical brain. Chronic small-vessel ischemic change of the white matter. No mass lesion, hemorrhage, hydrocephalus or extra-axial collection. Vascular: Major vessels at the base of the brain show flow. Skull and upper cervical spine: Negative Sinuses/Orbits: Clear/normal Other: None MRA HEAD FINDINGS Anterior circulation: Both internal carotid arteries widely patent through the skull base and siphon regions. On the right, the anterior and middle cerebral vessels patent. There is focal stenosis at the distal siphon estimated at 50%. More distal branch vessels do show some atherosclerotic irregularity. On the left, the proximal anterior and middle cerebral vessels are patent. There is chronic occlusion in the left  inferior division. Posterior circulation: Both vertebral arteries widely patent to the basilar. No basilar stenosis. Posterior circulation branch vessels show flow. Anatomic variants: None significant. IMPRESSION: No acute or subacute infarction. Old left middle cerebral artery territory stroke. Old lacunar infarction right thalamus. Chronic small-vessel changes of the pons and hemispheric white matter. No sign of acute large or medium vessel occlusion. 50% stenosis at the distal siphon on the right. Chronically occluded left MCA inferior division branch serving the region of chronic infarction. Some atherosclerotic irregularity of the more distal intracranial branch vessels. Electronically Signed: By: Nelson Chimes M.D. On: 02/26/2021 14:05   ECHOCARDIOGRAM COMPLETE  Result Date: 02/27/2021    ECHOCARDIOGRAM REPORT   Patient Name:   [facility] Date of Exam: 02/27/2021 Medical Rec #:  [id number]      Height:       66.0 in Accession #:    [id number]     Weight:       228.0 lb Date of Birth:  Mar 14, 1956      BSA:          2.114 m Patient Age:    65 years       BP:           150/77 mmHg Patient Gender: F              HR:           77 bpm. Exam Location:  Inpatient Procedure: 2D Echo, Cardiac Doppler and Color Doppler Indications:    Stroke  History:        Patient has prior history of Echocardiogram examinations, most                 recent 03/01/2012. Risk Factors:Obesity.  Sonographer:    Merrie Roof RDCS Referring Phys: [id number] IVELISSE OLIVENCIA-SIMMONS IMPRESSIONS  1. Left ventricular ejection fraction, by estimation, is 60 to 65%. The left ventricle has normal function. The left ventricle has no regional wall motion abnormalities. Left ventricular diastolic parameters are consistent with Grade I diastolic dysfunction (impaired relaxation).  2. Right ventricular systolic function is normal. The right ventricular size is normal.  3. Left atrial size was mildly dilated.  4. The mitral valve is normal in  structure. No evidence of mitral valve regurgitation. No evidence of mitral stenosis.  5. The aortic valve is normal in structure. Aortic valve regurgitation is not visualized. No aortic stenosis is present.  6. The inferior vena cava is normal in size with greater than 50% respiratory variability, suggesting right atrial pressure of 3 mmHg. FINDINGS  Left Ventricle: Left ventricular ejection fraction, by estimation, is 60 to 65%. The left ventricle has normal function. The left ventricle has no regional wall motion abnormalities.  The left ventricular internal cavity size was normal in size. There is  no left ventricular hypertrophy. Left ventricular diastolic parameters are consistent with Grade I diastolic dysfunction (impaired relaxation). Indeterminate filling pressures. Right Ventricle: The right ventricular size is normal. No increase in right ventricular wall thickness. Right ventricular systolic function is normal. Left Atrium: Left atrial size was mildly dilated. Right Atrium: Right atrial size was normal in size. Pericardium: There is no evidence of pericardial effusion. Mitral Valve: The mitral valve is normal in structure. No evidence of mitral valve regurgitation. No evidence of mitral valve stenosis. Tricuspid Valve: The tricuspid valve is normal in structure. Tricuspid valve regurgitation is not demonstrated. No evidence of tricuspid stenosis. Aortic Valve: The aortic valve is normal in structure. Aortic valve regurgitation is not visualized. No aortic stenosis is present. Aortic valve mean gradient measures 4.0 mmHg. Aortic valve peak gradient measures 8.0 mmHg. Aortic valve area, by VTI measures 2.20 cm. Pulmonic Valve: The pulmonic valve was normal in structure. Pulmonic valve regurgitation is not visualized. No evidence of pulmonic stenosis. Aorta: The aortic root is normal in size and structure. Venous: The inferior vena cava is normal in size with greater than 50% respiratory variability,  suggesting right atrial pressure of 3 mmHg. IAS/Shunts: No atrial level shunt detected by color flow Doppler.  LEFT VENTRICLE PLAX 2D LVIDd:         4.80 cm  Diastology LVIDs:         3.10 cm  LV e' medial:    7.72 cm/s LV PW:         1.00 cm  LV E/e' medial:  11.6 LV IVS:        1.00 cm  LV e' lateral:   7.72 cm/s LVOT diam:     2.00 cm  LV E/e' lateral: 11.6 LV SV:         70 LV SV Index:   33 LVOT Area:     3.14 cm  LEFT ATRIUM             Index LA diam:        3.90 cm 1.84 cm/m LA Vol (A2C):   68.8 ml 32.54 ml/m LA Vol (A4C):   59.2 ml 28.00 ml/m LA Biplane Vol: 69.1 ml 32.68 ml/m  AORTIC VALVE AV Area (Vmax):    2.01 cm AV Area (Vmean):   2.00 cm AV Area (VTI):     2.20 cm AV Vmax:           141.00 cm/s AV Vmean:          97.000 cm/s AV VTI:            0.320 m AV Peak Grad:      8.0 mmHg AV Mean Grad:      4.0 mmHg LVOT Vmax:         90.20 cm/s LVOT Vmean:        61.700 cm/s LVOT VTI:          0.224 m LVOT/AV VTI ratio: 0.70  AORTA Ao Root diam: 3.50 cm Ao Asc diam:  3.50 cm MITRAL VALVE MV Area (PHT): 5.27 cm     SHUNTS MV Decel Time: 144 msec     Systemic VTI:  0.22 m MV E velocity: 89.70 cm/s   Systemic Diam: 2.00 cm MV A velocity: 106.00 cm/s MV E/A ratio:  0.85 Mihai Croitoru MD Electronically signed by Sanda Klein MD Signature Date/Time: 02/27/2021/5:38:48 PM    Final  CT HEAD CODE STROKE WO CONTRAST  Result Date: 02/26/2021 CLINICAL DATA:  Code stroke.  Weakness, slurred speech EXAM: CT HEAD WITHOUT CONTRAST TECHNIQUE: Contiguous axial images were obtained from the base of the skull through the vertex without intravenous contrast. COMPARISON:  02/20/2021 FINDINGS: Brain: There is no acute intracranial hemorrhage, mass effect, or edema. No new loss of gray-white differentiation. Chronic infarcts of the left MCA territory are again identified. Chronic right thalamic infarct is also again noted. Stable few additional findings of probable chronic microvascular ischemic changes. Ventricles  are stable in size. Vascular: No hyperdense vessel. There is intracranial atherosclerotic calcification at the skull base. Skull: Unremarkable. Sinuses/Orbits: No acute abnormality. Other: Mastoid air cells are clear. IMPRESSION: No acute intracranial hemorrhage or evidence of acute infarction. No change since 02/20/2021. These results were communicated to Dr. Leonel Ramsay at 12:55 pm on 02/26/2021 by text page via the [facility] messaging system. Electronically Signed   By: Macy Mis M.D.   On: 02/26/2021 12:58    (Echo, Carotid, EGD, Colonoscopy, ERCP)    Subjective: Seen and examined the day of discharge.  Stable, no distress.  Endorses poor sleep.  Stable for discharge home.  Seen by neurology.  Discharge Exam: Vitals:   03/01/21 0745 03/01/21 1129  BP: 139/70 (!) 145/71  Pulse: 80 85  Resp: 16 20  Temp: (!) 97.5 F (36.4 C) 97.8 F (36.6 C)  SpO2: 95% 96%   Vitals:   03/01/21 0042 03/01/21 0353 03/01/21 0745 03/01/21 1129  BP: (!) 143/74 (!) 153/69 139/70 (!) 145/71  Pulse: 72 69 80 85  Resp: 20 18 16 20   Temp: 98.2 F (36.8 C) 97.8 F (36.6 C) (!) 97.5 F (36.4 C) 97.8 F (36.6 C)  TempSrc: Oral Oral Oral Oral  SpO2: 96% 97% 95% 96%  Weight:      Height:        General: Pt is alert, awake, not in acute distress Cardiovascular: RRR, S1/S2 +, no rubs, no gallops Respiratory: CTA bilaterally, no wheezing, no rhonchi Abdominal: Soft, NT, ND, bowel sounds + Extremities: no edema, no cyanosis    The results of significant diagnostics from this hospitalization (including imaging, microbiology, ancillary and laboratory) are listed below for reference.     Microbiology: Recent Results (from the past 240 hour(s))  SARS CORONAVIRUS 2 (TAT 6-24 HRS) Nasopharyngeal Nasopharyngeal Swab     Status: None   Collection Time: 02/26/21  2:46 PM   Specimen: Nasopharyngeal Swab  Result Value Ref Range Status   SARS Coronavirus 2 NEGATIVE NEGATIVE Final    Comment:  (NOTE) SARS-CoV-2 target nucleic acids are NOT DETECTED.  The SARS-CoV-2 RNA is generally detectable in upper and lower respiratory specimens during the acute phase of infection. Negative results do not preclude SARS-CoV-2 infection, do not rule out co-infections with other pathogens, and should not be used as the sole basis for treatment or other patient management decisions. Negative results must be combined with clinical observations, patient history, and epidemiological information. The expected result is Negative.  Fact Sheet for Patients: SugarRoll.be  Fact Sheet for Healthcare Providers: https://www.woods-mathews.com/  This test is not yet approved or cleared by the [place] FDA and  has been authorized for detection and/or diagnosis of SARS-CoV-2 by FDA under an Emergency Use Authorization (EUA). This EUA will remain  in effect (meaning this test can be used) for the duration of the COVID-19 declaration under Se ction 564(b)(1) of the Act, 21 U.S.C. section 360bbb-3(b)(1), unless the authorization is terminated  or revoked sooner.  Performed at [facility], [place] [address] [place], [place] [address]      Labs: BNP (last 3 results) No results for input(s): BNP in the last 8760 hours. Basic Metabolic Panel: Recent Labs  Lab 02/26/21 1224 02/26/21 1254 02/27/21 0349  NA 140 141 140  K 3.5 3.5 3.5  CL 106 104 106  CO2 24  --  25  GLUCOSE 149* 143* 127*  BUN 10 10 10   CREATININE 0.87 0.70 0.81  CALCIUM 8.8*  --  8.9   Liver Function Tests: Recent Labs  Lab 02/26/21 1224  AST 31  ALT 25  ALKPHOS 73  BILITOT 0.6  PROT 6.7  ALBUMIN 3.5   No results for input(s): LIPASE, AMYLASE in the last 168 hours. No results for input(s): AMMONIA in the last 168 hours. CBC: Recent Labs  Lab 02/26/21 1224 02/26/21 1254 02/27/21 0349  WBC 10.8*  --  8.0  NEUTROABS 7.9*  --   --   HGB 12.4 12.9 11.2*  HCT 40.3  38.0 36.3  MCV 86.9  --  85.6  PLT 398  --  383   Cardiac Enzymes: No results for input(s): CKTOTAL, CKMB, CKMBINDEX, TROPONINI in the last 168 hours. BNP: Invalid input(s): POCBNP CBG: Recent Labs  Lab 02/28/21 1303 02/28/21 1529 02/28/21 2128 03/01/21 0632 03/01/21 1202  GLUCAP 143* 164* 143* 125* 170*   D-Dimer No results for input(s): DDIMER in the last 72 hours. Hgb A1c No results for input(s): HGBA1C in the last 72 hours. Lipid Profile Recent Labs    02/27/21 0349  CHOL 159  HDL 50  LDLCALC 88  TRIG 105  CHOLHDL 3.2   Thyroid function studies No results for input(s): TSH, T4TOTAL, T3FREE, THYROIDAB in the last 72 hours.  Invalid input(s): FREET3 Anemia work up No results for input(s): VITAMINB12, FOLATE, FERRITIN, TIBC, IRON, RETICCTPCT in the last 72 hours. Urinalysis    Component Value Date/Time   COLORURINE AMBER (A) 03/21/2015 1541   APPEARANCEUR CLEAR 03/21/2015 1541   LABSPEC 1.017 03/21/2015 1541   PHURINE 6.5 03/21/2015 1541   GLUCOSEU NEGATIVE 03/21/2015 1541   HGBUR NEGATIVE 03/21/2015 1541   BILIRUBINUR NEGATIVE 03/21/2015 1541   KETONESUR NEGATIVE 03/21/2015 1541   PROTEINUR NEGATIVE 03/21/2015 1541   UROBILINOGEN 1.0 03/21/2015 1541   NITRITE NEGATIVE 03/21/2015 1541   LEUKOCYTESUR TRACE (A) 03/21/2015 1541   Sepsis Labs Invalid input(s): PROCALCITONIN,  WBC,  LACTICIDVEN Microbiology Recent Results (from the past 240 hour(s))  SARS CORONAVIRUS 2 (TAT 6-24 HRS) Nasopharyngeal Nasopharyngeal Swab     Status: None   Collection Time: 02/26/21  2:46 PM   Specimen: Nasopharyngeal Swab  Result Value Ref Range Status   SARS Coronavirus 2 NEGATIVE NEGATIVE Final    Comment: (NOTE) SARS-CoV-2 target nucleic acids are NOT DETECTED.  The SARS-CoV-2 RNA is generally detectable in upper and lower respiratory specimens during the acute phase of infection. Negative results do not preclude SARS-CoV-2 infection, do not rule out co-infections with  other pathogens, and should not be used as the sole basis for treatment or other patient management decisions. Negative results must be combined with clinical observations, patient history, and epidemiological information. The expected result is Negative.  Fact Sheet for Patients: SugarRoll.be  Fact Sheet for Healthcare Providers: https://www.woods-mathews.com/  This test is not yet approved or cleared by the [place] FDA and  has been authorized for detection and/or diagnosis of SARS-CoV-2 by FDA under an Emergency Use Authorization (EUA).  This EUA will remain  in effect (meaning this test can be used) for the duration of the COVID-19 declaration under Se ction 564(b)(1) of the Act, 21 U.S.C. section 360bbb-3(b)(1), unless the authorization is terminated or revoked sooner.  Performed at [facility], [place] [address], [place], [place] [address]      Time coordinating discharge: Over 30 minutes  SIGNED:   Sidney Ace, MD  [facility] 03/01/2021, 1:06 PM Pager   If 7PM-7AM, please contact night-coverage

## 2021-03-01 NOTE — Plan of Care (Signed)
  Problem: Education: Goal: Knowledge of disease or condition will improve Outcome: Progressing Goal: Knowledge of secondary prevention will improve Outcome: Progressing Goal: Knowledge of patient specific risk factors addressed and post discharge goals established will improve Outcome: Progressing Goal: Individualized Educational Video(s) Outcome: Progressing   Problem: Coping: Goal: Will verbalize positive feelings about self Outcome: Progressing Goal: Will identify appropriate support needs Outcome: Progressing   Problem: Health Behavior/Discharge Planning: Goal: Ability to manage health-related needs will improve Outcome: Progressing   Problem: Self-Care: Goal: Ability to participate in self-care as condition permits will improve Outcome: Progressing Goal: Verbalization of feelings and concerns over difficulty with self-care will improve Outcome: Progressing Goal: Ability to communicate needs accurately will improve Outcome: Progressing   Problem: Nutrition: Goal: Risk of aspiration will decrease Outcome: Progressing Goal: Dietary intake will improve Outcome: Progressing   Problem: Intracerebral Hemorrhage Tissue Perfusion: Goal: Complications of Intracerebral Hemorrhage will be minimized Outcome: Progressing   Problem: Ischemic Stroke/TIA Tissue Perfusion: Goal: Complications of ischemic stroke/TIA will be minimized Outcome: Progressing   Problem: Spontaneous Subarachnoid Hemorrhage Tissue Perfusion: Goal: Complications of Spontaneous Subarachnoid Hemorrhage will be minimized Outcome: Progressing   Problem: Education: Goal: Knowledge of General Education information will improve Description: Including pain rating scale, medication(s)/side effects and non-pharmacologic comfort measures Outcome: Progressing   Problem: Health Behavior/Discharge Planning: Goal: Ability to manage health-related needs will improve Outcome: Progressing   Problem: Clinical  Measurements: Goal: Ability to maintain clinical measurements within normal limits will improve Outcome: Progressing Goal: Will remain free from infection Outcome: Progressing Goal: Diagnostic test results will improve Outcome: Progressing Goal: Respiratory complications will improve Outcome: Progressing Goal: Cardiovascular complication will be avoided Outcome: Progressing   Problem: Activity: Goal: Risk for activity intolerance will decrease Outcome: Progressing   Problem: Nutrition: Goal: Adequate nutrition will be maintained Outcome: Progressing   Problem: Coping: Goal: Level of anxiety will decrease Outcome: Progressing   Problem: Elimination: Goal: Will not experience complications related to bowel motility Outcome: Progressing Goal: Will not experience complications related to urinary retention Outcome: Progressing   Problem: Pain Managment: Goal: General experience of comfort will improve Outcome: Progressing   Problem: Safety: Goal: Ability to remain free from injury will improve Outcome: Progressing   Problem: Skin Integrity: Goal: Risk for impaired skin integrity will decrease Outcome: Progressing

## 2021-03-01 NOTE — Progress Notes (Signed)
Physical Therapy Treatment Patient Details Name: Andrea Cortez MRN: [id number] DOB: 08/30/1956 Today's Date: 03/01/2021    History of Present Illness Pt is a 65 yo female presents to [facility] on 5/24 with acute onset of L sided weakness. MRI brain: R thalamic CVA. Of note, pt was admitted to [facility] 5/18 for ischemic CVA requiring tPA and symptoms resolved.  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    PT Comments    Patient continues to progress towards physical therapy goals. Patient reports close to baseline but still has trouble with balance, gait speed, and speech. Patient supervision for all mobility. Educated patient on fall risk and need for supervision for safety, patient verbalized understanding. Continue to recommend OPPT following discharge to maximize functional independence.     Follow Up Recommendations  Outpatient PT     Equipment Recommendations  None recommended by PT    Recommendations for Other Services       Precautions / Restrictions Precautions Precautions: Fall Restrictions Weight Bearing Restrictions: No    Mobility  Bed Mobility Overal bed mobility: Needs Assistance Bed Mobility: Supine to Sit;Sit to Supine     Supine to sit: Supervision;HOB elevated Sit to supine: Supervision;HOB elevated   General bed mobility comments: supervision for safety    Transfers Overall transfer level: Needs assistance Equipment used: Straight cane Transfers: Sit to/from Stand Sit to Stand: Supervision         General transfer comment: supervision for safety  Ambulation/Gait Ambulation/Gait assistance: Supervision Gait Distance (Feet): 300 Feet Assistive device: Straight cane Gait Pattern/deviations: Step-through pattern;Decreased stride length;Trunk flexed;Wide base of support;Decreased dorsiflexion - right Gait velocity: decr   General Gait Details:  supervision for safety, decreased gait speed with talking.   Stairs             Wheelchair Mobility    Modified Rankin (Stroke Patients Only) Modified Rankin (Stroke Patients Only) Pre-Morbid Rankin Score: Moderate disability Modified Rankin: Moderate disability     Balance Overall balance assessment: Needs assistance;History of Falls Sitting-balance support: Feet supported;No upper extremity supported Sitting balance-Leahy Scale: Good     Standing balance support: During functional activity;Single extremity supported Standing balance-Leahy Scale: Poor Standing balance comment: reliant on cane                            Cognition Arousal/Alertness: Awake/alert Behavior During Therapy: WFL for tasks assessed/performed Overall Cognitive Status: Within Functional Limits for tasks assessed                                 General Comments: A/O x4      Exercises      General Comments        Pertinent Vitals/Pain Pain Assessment: No/denies pain    Home Living                      Prior Function            PT Goals (current goals can now be found in the care plan section) Acute Rehab PT Goals Patient Stated Goal: go home, return to independence PT Goal Formulation: With patient Time For Goal Achievement: 03/13/21 Potential to Achieve Goals: Good Progress towards PT goals: Progressing toward goals    Frequency    Min 4X/week      PT  Plan Current plan remains appropriate    Co-evaluation              AM-PAC PT "6 Clicks" Mobility   Outcome Measure  Help needed turning from your back to your side while in a flat bed without using bedrails?: A Little Help needed moving from lying on your back to sitting on the side of a flat bed without using bedrails?: A Little Help needed moving to and from a bed to a chair (including a wheelchair)?: A Little Help needed standing up from a chair using your arms (e.g.,  wheelchair or bedside chair)?: A Little Help needed to walk in hospital room?: A Little Help needed climbing 3-5 steps with a railing? : A Lot 6 Click Score: 17    End of Session Equipment Utilized During Treatment: Gait belt Activity Tolerance: Patient tolerated treatment well Patient left: in bed;with call bell/phone within reach;with family/visitor present (pt knows to have assist with mobility, bed alarm not on upon PT arrival to room)   PT Visit Diagnosis: Other abnormalities of gait and mobility (R26.89);Difficulty in walking, not elsewhere classified (R26.2)     Time: 1349-1401 PT Time Calculation (min) (ACUTE ONLY): 12 min  Charges:  $Therapeutic Activity: 8-22 mins                     Codylee Patil A. Gilford Rile PT, DPT Acute Rehabilitation Services Pager [phone] Office [phone]    Linna Hoff 03/01/2021, 2:36 PM

## 2021-03-01 NOTE — Progress Notes (Signed)
STROKE TEAM PROGRESS NOTE   INTERVAL HISTORY Patient is sitting up in bed.  She is feeling fine.  She did NOx 3 monitor overnight as part of sleep smart study.  Results are pending.  Daughter at bedside. Awake and interactive. Afebrile. VSS.  Neurological exam is unchanged.  Vitals:   03/01/21 0042 03/01/21 0353 03/01/21 0745 03/01/21 1129  BP: (!) 143/74 (!) 153/69 139/70 (!) 145/71  Pulse: 72 69 80 85  Resp: 20 18 16 20   Temp: 98.2 F (36.8 C) 97.8 F (36.6 C) (!) 97.5 F (36.4 C) 97.8 F (36.6 C)  TempSrc: Oral Oral Oral Oral  SpO2: 96% 97% 95% 96%  Weight:      Height:       CBC:  Recent Labs  Lab 02/26/21 1224 02/26/21 1254 02/27/21 0349  WBC 10.8*  --  8.0  NEUTROABS 7.9*  --   --   HGB 12.4 12.9 11.2*  HCT 40.3 38.0 36.3  MCV 86.9  --  85.6  PLT 398  --  283   Basic Metabolic Panel:  Recent Labs  Lab 02/26/21 1224 02/26/21 1254 02/27/21 0349  NA 140 141 140  K 3.5 3.5 3.5  CL 106 104 106  CO2 24  --  25  GLUCOSE 149* 143* 127*  BUN 10 10 10   CREATININE 0.87 0.70 0.81  CALCIUM 8.8*  --  8.9   Lipid Panel:  Recent Labs  Lab 02/27/21 0349  CHOL 159  TRIG 105  HDL 50  CHOLHDL 3.2  VLDL 21  LDLCALC 88   HgbA1c: No results for input(s): HGBA1C in the last 168 hours. Urine Drug Screen: No results for input(s): LABOPIA, COCAINSCRNUR, LABBENZ, AMPHETMU, THCU, LABBARB in the last 168 hours.  Alcohol Level No results for input(s): ETH in the last 168 hours.  IMAGING past 24 hours MR ANGIO HEAD WO CONTRAST  Addendum Date: 02/26/2021   ADDENDUM REPORT: 02/26/2021 14:37 ADDENDUM: Dr. Leonel Ramsay called to discuss the case. There could be some early restricted diffusion developing in the right basal ganglia/posterior limb internal capsule/lateral thalamus region. I do not think this is definite but could be an early real finding. Electronically Signed   By: Nelson Chimes M.D.   On: 02/26/2021 14:37   Result Date: 02/26/2021 IMPRESSION: No acute or subacute  infarction. Old left middle cerebral artery territory stroke. Old lacunar infarction right thalamus. Chronic small-vessel changes of the pons and hemispheric white matter. No sign of acute large or medium vessel occlusion. 50% stenosis at the distal siphon on the right. Chronically occluded left MCA inferior division branch serving the region of chronic infarction. Some atherosclerotic irregularity of the more distal intracranial branch vessels  MR BRAIN WO CONTRAST  Addendum Date: 02/26/2021   ADDENDUM REPORT: 02/26/2021 14:37 ADDENDUM: Dr. Leonel Ramsay called to discuss the case. There could be some early restricted diffusion developing in the right basal ganglia/posterior limb internal capsule/lateral thalamus region. I do not think this is definite but could be an early real finding. Electronically Signed   By: Nelson Chimes M.D.   On: 02/26/2021 14:37   Result Date: 02/26/2021  IMPRESSION: No acute or subacute infarction. Old left middle cerebral artery territory stroke. Old lacunar infarction right thalamus. Chronic small-vessel changes of the pons and hemispheric white matter. No sign of acute large or medium vessel occlusion. 50% stenosis at the distal siphon on the right. Chronically occluded left MCA inferior division branch serving the region of chronic infarction. Some atherosclerotic irregularity of  the more distal intracranial branch vessels. Electronically Signed: By: Nelson Chimes M.D. On: 02/26/2021 14:05   CT HEAD CODE STROKE WO CONTRAST  Result Date: 02/26/2021 IMPRESSION: No acute intracranial hemorrhage or evidence of acute infarction. No change since 02/20/2021.   PHYSICAL EXAM Constitutional: Obese middle-aged Caucasian lady not in distress. Psych: Affect appropriate to situation Eyes: No scleral injection HENT: No OP obstruction MSK: no joint deformities.  Cardiovascular: Normal rate and regular rhythm.  Respiratory: Effort normal, non-labored breathing GI: Soft.  No  distension. There is no tenderness.  Skin: WDI  Neuro: Mental Status: Patient is awake, alert, mild expressive aphasia and nonfluent speech Cranial Nerves: II: Visual Fields are full. Pupils are equal, round, and reactive to light.   III,IV, VI: EOMI without ptosis or diploplia.  V: Facial sensation is diminished on the right VII: She has mild right lower facial weakness VIII: hearing is intact to voice X: Uvula elevates symmetrically XI: Shoulder shrug is symmetric. XII: tongue is midline without atrophy or fasciculations.  Motor: She has 4 -/5 weakness on the right (drift) and 4+/5 weakness on the left in both the arm and leg with mild right upper and lower extremity drift. Sensory: Sensation is diminished on the right compared to left  NIHSS 5   ASSESSMENT/PLAN [name] is a 65 y.o. female with history of left MCA stroke with right sided residual weakness, DM2, HLD, depression, dementia, COPD/tobacco abuse, and seizure disorder who presented to the ER with left sided weakness.  MRI/A showed right thalamic stroke but revealed no evidence of a large vessel occlusion.  Possible experiencing recrudescence of symptoms versus extension of prior event.    Stroke:  right thalamic infarct likely secondary to small vessel disease source.  History of remote left MCA infarct in 2004 and right thalamic infarct .  Code Stroke: CT head No acute abnormality.  MRI/A  early restricted diffusion developing in the right basal ganglia/posterior limb internal capsule/lateral thalamus region.  2D Echo ejection fraction 60 to 65%.  No clot.    LDL 88  HgbA1c (5/19): 6.5  VTE prophylaxis - lovenox  Diet: heart healthy  Aspirin 81mg  daily prior to admission, now on aspirin 81 mg daily and clopidogrel 75 mg daily.  DAPT for 3 weeks then continue Plavix alone  Therapy recommendations:  Outpatient PT  Disposition:  pending  Recent stroke  02/20/2021 pt develop acute left upper  extremity weakness and dysarthria.  Initially evaluated at [facility].  NIHSS 11.  CT head showed left MCA infarct. tPA was given.  She was transferred to [facility].  NIHSS improved to 4 (for new dysarthria and baseline right-sided weakness and facial droop), and full recovery of left sided weakness.  No further imaging obtained.    Stroke workup: LDL 89, HGB A1C 6.5, carotid dopplers showed no significant stenosis.  She was placed on aspirin 81mg  and rosuvastatin 5mg  daily.   Hypertension  Home meds: none  Stable . Permissive hypertension (OK if < 220/120) but gradually normalize in 5-7 days . Long-term BP goal normotensive  Hyperlipidemia  Home meds:  crestor 40, resumed in hospital  LDL 88, goal < 70  Continue statin at discharge  Diabetes type II Controlled  Home meds:  Metformin 500mg  bid  HgbA1c 6.5 (5/18)., goal < 7.0  CBGs Recent Labs    02/28/21 1529 02/28/21 2128 03/01/21 0632  GLUCAP 164* 143* 125*      SSI  Other Stroke Risk Factors  Advanced Age >/=  65   Cigarette smoker, remote. Quit 9 years ago.  43 pack-year smoking history  Morbid Obesity, Body mass index is 36.8 kg/m., BMI >/= 30 associated with increased stroke risk, recommend weight loss, diet and exercise as appropriate   Hx stroke 2004, on Feb 20, 2021 received [facility]PA [facility]: left slightly hemorrhagic left temporal and parietal infarct, new acute right medial thalamus, and left anterior pons --> had extended rehabilitation course // Continues to have residual right UE weakness  COPD  Other Active Problems  Seizures: takes zonegran 200 mg nightly  GERD: take protonix  Dementia: Aricept 10mg  nightly  Hospital day # 1    Patient has a history of multiple strokes and with residual mild expressive aphasia and right hemi-paresis presents with left-sided numbness due to new right thalamic subcortical infarct about a week ago for which she was treated at an outside hospital with  tPA presents with worsening of symptoms due to evolution of her stroke without new definite stroke noted during the present admission.  Continue aspirin and Plavix for 3 weeks followed by Plavix alone and aggressive risk factor modification.  She is participating in the sleep smart study  NOx 3 monitor study study results from last night are yet pending..  Discussed with patient, daughter and Dr. Dwyane Dee.  She will likely be discharged home and follow-up as an outpatient as per sleep smart study protocol. Greater and 50% time during this 25-minute visit was spent in counseling and coordination of care about the thalamic stroke and discussion about stroke prevention treatment and sleep apnea and answering questions. Antony Contras, MD Medical Director [place] Pager: [phone] 03/01/2021 12:00 PM  To contact Stroke Continuity provider, please refer to [email or web address] After hours, contact General Neurology

## 2021-05-28 ENCOUNTER — Ambulatory Visit: Payer: Self-pay | Admitting: Neurology

## 2021-08-20 ENCOUNTER — Ambulatory Visit: Payer: Medicare HMO | Admitting: Neurology

## 2022-02-14 ENCOUNTER — Other Ambulatory Visit: Payer: Self-pay | Admitting: Oncology

## 2022-02-14 ENCOUNTER — Inpatient Hospital Stay: Payer: Medicare HMO | Attending: Oncology | Admitting: Oncology

## 2022-02-14 ENCOUNTER — Encounter: Payer: Self-pay | Admitting: Oncology

## 2022-02-14 ENCOUNTER — Inpatient Hospital Stay: Payer: Medicare HMO

## 2022-02-14 ENCOUNTER — Other Ambulatory Visit: Payer: Self-pay | Admitting: Hematology and Oncology

## 2022-02-14 VITALS — BP 113/56 | HR 98 | Temp 98.2°F | Resp 16 | Ht 65.0 in | Wt 210.0 lb

## 2022-02-14 DIAGNOSIS — L03115 Cellulitis of right lower limb: Secondary | ICD-10-CM | POA: Diagnosis not present

## 2022-02-14 DIAGNOSIS — R160 Hepatomegaly, not elsewhere classified: Secondary | ICD-10-CM

## 2022-02-14 DIAGNOSIS — B3731 Acute candidiasis of vulva and vagina: Secondary | ICD-10-CM

## 2022-02-14 DIAGNOSIS — D739 Disease of spleen, unspecified: Secondary | ICD-10-CM

## 2022-02-14 DIAGNOSIS — Z8541 Personal history of malignant neoplasm of cervix uteri: Secondary | ICD-10-CM | POA: Insufficient documentation

## 2022-02-14 DIAGNOSIS — D539 Nutritional anemia, unspecified: Secondary | ICD-10-CM | POA: Insufficient documentation

## 2022-02-14 DIAGNOSIS — Z8673 Personal history of transient ischemic attack (TIA), and cerebral infarction without residual deficits: Secondary | ICD-10-CM | POA: Insufficient documentation

## 2022-02-14 DIAGNOSIS — C787 Secondary malignant neoplasm of liver and intrahepatic bile duct: Secondary | ICD-10-CM | POA: Insufficient documentation

## 2022-02-14 DIAGNOSIS — R911 Solitary pulmonary nodule: Secondary | ICD-10-CM | POA: Diagnosis not present

## 2022-02-14 DIAGNOSIS — R1011 Right upper quadrant pain: Secondary | ICD-10-CM | POA: Diagnosis not present

## 2022-02-14 DIAGNOSIS — I69351 Hemiplegia and hemiparesis following cerebral infarction affecting right dominant side: Secondary | ICD-10-CM | POA: Diagnosis not present

## 2022-02-14 DIAGNOSIS — K8689 Other specified diseases of pancreas: Secondary | ICD-10-CM | POA: Insufficient documentation

## 2022-02-14 DIAGNOSIS — D649 Anemia, unspecified: Secondary | ICD-10-CM | POA: Diagnosis not present

## 2022-02-14 DIAGNOSIS — R2243 Localized swelling, mass and lump, lower limb, bilateral: Secondary | ICD-10-CM

## 2022-02-14 DIAGNOSIS — M79606 Pain in leg, unspecified: Secondary | ICD-10-CM | POA: Insufficient documentation

## 2022-02-14 DIAGNOSIS — C801 Malignant (primary) neoplasm, unspecified: Secondary | ICD-10-CM | POA: Diagnosis present

## 2022-02-14 DIAGNOSIS — R600 Localized edema: Secondary | ICD-10-CM

## 2022-02-14 DIAGNOSIS — Z602 Problems related to living alone: Secondary | ICD-10-CM | POA: Diagnosis not present

## 2022-02-14 DIAGNOSIS — I639 Cerebral infarction, unspecified: Secondary | ICD-10-CM

## 2022-02-14 LAB — BASIC METABOLIC PANEL
BUN: 14 (ref 4–21)
CO2: 26 — AB (ref 13–22)
Chloride: 98 — AB (ref 99–108)
Creatinine: 0.9 (ref 0.5–1.1)
Glucose: 143
Potassium: 3.5 mEq/L (ref 3.5–5.1)
Sodium: 138 (ref 137–147)

## 2022-02-14 LAB — CBC AND DIFFERENTIAL
HCT: 31 — AB (ref 36–46)
Hemoglobin: 9.3 — AB (ref 12.0–16.0)
Neutrophils Absolute: 14.82
Platelets: 528 10*3/uL — AB (ref 150–400)
WBC: 24.3

## 2022-02-14 LAB — HEPATIC FUNCTION PANEL
ALT: 21 U/L (ref 7–35)
AST: 54 — AB (ref 13–35)
Alkaline Phosphatase: 684 — AB (ref 25–125)
Bilirubin, Total: 1

## 2022-02-14 LAB — APTT: aPTT: 37 seconds — ABNORMAL HIGH (ref 24–36)

## 2022-02-14 LAB — PROTIME-INR
INR: 1.2 (ref 0.8–1.2)
Prothrombin Time: 15.1 seconds (ref 11.4–15.2)

## 2022-02-14 LAB — COMPREHENSIVE METABOLIC PANEL
Albumin: 3.1 — AB (ref 3.5–5.0)
Calcium: 8.3 — AB (ref 8.7–10.7)

## 2022-02-14 LAB — CBC: RBC: 4.4 (ref 3.87–5.11)

## 2022-02-14 MED ORDER — FLUCONAZOLE 100 MG PO TABS: 100.0000 mg | ORAL_TABLET | Freq: Every day | ORAL | 0 refills | Status: AC

## 2022-02-14 MED ORDER — CEPHALEXIN 500 MG PO CAPS: 500.0000 mg | ORAL_CAPSULE | Freq: Three times a day (TID) | ORAL | 0 refills | Status: AC

## 2022-02-14 MED ORDER — TRAMADOL HCL 50 MG PO TABS: 50.0000 mg | ORAL_TABLET | Freq: Four times a day (QID) | ORAL | 0 refills | Status: AC | PRN

## 2022-02-15 LAB — AFP TUMOR MARKER: AFP, Serum, Tumor Marker: <1.8 ng/mL (ref 0.0–9.2)

## 2022-02-15 LAB — CANCER ANTIGEN 19-9: CA 19-9: 50 U/mL — ABNORMAL HIGH (ref 0–35)

## 2022-02-15 LAB — CEA: CEA: 12.4 ng/mL — ABNORMAL HIGH (ref 0.0–4.7)

## 2022-02-16 ENCOUNTER — Encounter: Payer: Self-pay | Admitting: Cardiology

## 2022-02-16 DIAGNOSIS — I361 Nonrheumatic tricuspid (valve) insufficiency: Secondary | ICD-10-CM | POA: Diagnosis not present

## 2022-02-17 ENCOUNTER — Encounter: Payer: Self-pay | Admitting: Oncology

## 2022-02-17 DIAGNOSIS — R911 Solitary pulmonary nodule: Secondary | ICD-10-CM

## 2022-02-17 DIAGNOSIS — R160 Hepatomegaly, not elsewhere classified: Secondary | ICD-10-CM

## 2022-02-17 DIAGNOSIS — C801 Malignant (primary) neoplasm, unspecified: Secondary | ICD-10-CM | POA: Diagnosis not present

## 2022-02-17 DIAGNOSIS — K8689 Other specified diseases of pancreas: Secondary | ICD-10-CM

## 2022-02-17 LAB — IRON AND TIBC
Iron: 27 ug/dL — ABNORMAL LOW (ref 28–170)
Saturation Ratios: 11 % (ref 10.4–31.8)
TIBC: 247 ug/dL — ABNORMAL LOW (ref 250–450)
UIBC: 220 ug/dL

## 2022-02-17 LAB — FERRITIN: Ferritin: 199 ng/mL (ref 11–307)

## 2022-02-17 LAB — FOLATE: Folate: 15.8 ng/mL (ref >5.9)

## 2022-02-17 LAB — VITAMIN B12: Vitamin B-12: 3566 pg/mL — ABNORMAL HIGH (ref 180–914)

## 2022-02-17 NOTE — Progress Notes (Signed)
?[facility]  ?[address] ?[place],  [place]  [address] ?[phone] ? ?Clinic Day:  02/14/22 ? ?Referring physician: Myrlene Broker, MD ? ? ?ASSESSMENT & PLAN:  ? ?Metastatic lesions of unknown primary ?She needs a tissue biopsy in order to diagnose the primary but it is not likely to be colon or upper GI tract.  I think the most likely would be pancreatic or lung primary.  The radiologist feels this may represent a primary hepatocellular carcinoma, but this would be an unusual pattern of metastases. ? ?Large hypodense liver mass ?This is mostly within the right hepatic lobe and measures 13 cm.  This is probably the best area to biopsy but she will have to be off her Plavix first.  I will check an alpha-fetoprotein. ? ?Hypodense mass of the head of the pancreas ?This measures 4.6 cm and is certainly suspicious for the primary.  I will order a CA 19-9 as well.  I explained this would be a more difficult area to biopsy and so hopefully tissue elsewhere will help [place] sort this out. ? ?13 mm nodule in the left lower lobe ?This could represent a small cell carcinoma of the lung, which would explain the widespread nature of metastases before the primary was even found.  She needs a CT of the chest eventually and I will order a CEA as well. ? ?Multiple hypodense lesions of the spleen ?These measure up to 2.6 cm in diameter and are likely metastatic as well. ? 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  Her performance status is 1-2 and so I told her to think about how aggressive she wishes to pursue this.  I can give her a better idea of the prognosis and options of treatment after we obtain a tissue diagnosis. ? ?Edema of the lower extremities ?She has erythema of the right anterior leg and this could represent cellulitis or deep venous thrombosis.  We will arrange for an ultrasound  Doppler but in the meantime treat her with antibiotics with Keflex.  Her daughter requests we also order something for a yeast infection as she commonly has this. ? ?Severe anemia ?This is likely iron deficiency but the labs are equivocal and so I will get additional labs today to check iron, TIBC, ferritin, B12 and folate. ? ? ?We will arrange for a biopsy of the liver but she will have to hold her Plavix first.  I will also get an MRI of the brain to complete staging.  She will also need a CT of the chest.  I have ordered tumor markers today as well as tests to evaluate her anemia.  We will get an ultrasound of the right lower extremity.  However I will treat this as cellulitis with Keflex 500 mg 3 times daily for 10 days.  I will also send in a prescription for Diflucan.  I will consider guardant testing of the biopsy as well as liquid 360 when she returns.  She may need IV iron.  I will try her on tramadol for the pain 50 mg 4 times daily as needed, #30.  The daughter asked that we mainly communicate through her as the patient has difficulty with her speech.  I will have her return after these tests are done so that we can have a discussion of the diagnosis, prognosis and options of therapy. I  discussed the assessment and treatment plan with the patient.  The patient and her family were provided an opportunity to ask questions and all were answered.  The patient and her family agreed with the plan and demonstrated an understanding of the instructions.  She was advised to call back if the symptoms worsen or if the condition fails to improve as anticipated. ? ?Thank you for the opportunity to participate in the care of your patients. ? ?I provided 60 minutes of face-to-face time during this this encounter and > 50% was spent counseling as documented under my assessment and plan.  ? ? ?Derwood Kaplan, MD ?[facility] ?[address] ?[place]  [place] [address] ?Dept: [phone] ?Dept Fax: [phone]  ? ?CHIEF COMPLAINT:  ?CC: Metastatic disease, unknown primary ? ?Current Treatment: Evaluation ? ? ?HISTORY OF PRESENT ILLNESS:  ?Andrea Cortez is a 66 y.o. female with a history of anemia who is referred in consultation with Dr. Janace Litten for assessment and management of what appears to be widely metastatic malignancy on CT scan.  This was ordered due to abnormal liver enzymes and a history of renal lithiasis.  The alkaline phosphatase was very elevated and the GGT was 404.  This reveals a 13 mm nodule in the subpleural left lower lobe with irregular slightly nodular appearing densities within the right middle lobe measuring up to 13 mm.  In the liver she has a large hypodense mass measuring 13 cm across and mostly located within the right hepatic lobe.  In the pancreas there is a 4.6 cm hypodense mass at the head of the pancreas and the spleen reveals multiple hypodense lesions measuring up to 2.6 cm.  There is a small left adrenal nodule measuring 11 mm and the kidneys appear unremarkable.  She has mildly prominent gastrohepatic nodes measuring up to 9 mm and multiple enlarged peripancreatic and porta hepatis nodes measuring up to 18 mm.  There are multiple enlarged mesenteric nodes measuring up to 13 mm and small retroperitoneal/precaval nodes measuring up to 11 mm.  There is a small volume of ascites in the pelvis and a soft tissue nodule within the right retroperitoneum measuring 8 mm.  No bone lesions are seen.  She does have cholelithiasis.  She has not had a biopsy or further evaluation yet.  She is anemic with a hemoglobin of 9.5 and MCV of 69.9.  White count is elevated at 22.2 with 60% neutrophils, 8% lymphocytes, 10% monocytes, 21% eosinophils and 1% basophils.  Platelet count is also elevated at 522,000.  Her iron level is less than 10 with a TIBC of 274 and a ferritin of 221.  The transferrin is low at 196.  Comprehensive metabolic profile  reveals elevation of the alkaline phosphatase to 648 with a total bilirubin of 1.1 but normal transaminases.  Calcium is low at 8.4 and total protein mildly low at 6.3 with an albumin of 2.9.  She did have colonoscopy in March 2020 which was negative other than external hemorrhoids.  She also had upper endoscopy and biopsies of the stomach revealed fundic gland polyps and was otherwise negative. ? ?INTERVAL HISTORY:  ?I have reviewed her chart and materials related to her cancer extensively and collaborated history with the patient. Summary of oncologic history is as follows: ?Oncology History  ? No history exists.  ? ? ?Andrea Cortez is seen in the clinic for follow up of her abnormal CT scan with suspicion for widely metastatic  disease but no clear primary.  She does complain of some right upper quadrant pain and says this goes up to 10 out of 10 at nighttime.  She also has pain of her legs and feet that she rates as a 7 out of 10 during the day.  She has had somewhere between 14 and 17 strokes and has a chronic right hemiparesis with a performance status of 1-2.  She does live alone and is able to function and take care of herself.  She denies nausea and vomiting but does have decreased appetite.  She had been evaluated by Dr. Lavera Guise in January 2020 for anemia which was found to be due to iron deficiency.  She received IV Feraheme as her ferritin was 8.7.  When she was seen back in follow-up in April 2021, her hemoglobin was up to 12.9 and ferritin was 44.6.  That is when she had a GI work-up with colonoscopy and EGD, which were negative for etiology of her iron deficiency.  Labs today reveal an alkaline phosphatase of 684 and SGOT of 54 but normal total bilirubin.  Her albumin is mildly low at 3.1 and a nonfasting blood sugar is 143.  Her white count is elevated at 24.3 with 61% neutrophils, 9% lymphocytes, 25% eosinophils, 3% monocytes, and 2% basophils.  Her hemoglobin is 9.3 with an MCV of 70 and platelet  count remains elevated at 528,000.  Her daughter and sister are here with her today and very supportive.  She is in a wheelchair.  She has a remote history of cervical cancer in approximately 1997. She de

## 2022-02-18 ENCOUNTER — Encounter: Payer: Self-pay | Admitting: Oncology

## 2022-02-18 DIAGNOSIS — R160 Hepatomegaly, not elsewhere classified: Secondary | ICD-10-CM | POA: Diagnosis not present

## 2022-02-18 DIAGNOSIS — R911 Solitary pulmonary nodule: Secondary | ICD-10-CM | POA: Diagnosis not present

## 2022-02-18 DIAGNOSIS — K8689 Other specified diseases of pancreas: Secondary | ICD-10-CM | POA: Diagnosis not present

## 2022-02-19 DIAGNOSIS — R911 Solitary pulmonary nodule: Secondary | ICD-10-CM | POA: Diagnosis not present

## 2022-02-19 DIAGNOSIS — K8689 Other specified diseases of pancreas: Secondary | ICD-10-CM | POA: Diagnosis not present

## 2022-02-19 DIAGNOSIS — R160 Hepatomegaly, not elsewhere classified: Secondary | ICD-10-CM | POA: Diagnosis not present

## 2022-02-20 ENCOUNTER — Other Ambulatory Visit: Payer: Self-pay | Admitting: Hematology and Oncology

## 2022-02-20 DIAGNOSIS — R911 Solitary pulmonary nodule: Secondary | ICD-10-CM

## 2022-02-21 ENCOUNTER — Inpatient Hospital Stay: Payer: Medicare HMO | Admitting: Hematology and Oncology

## 2022-02-21 ENCOUNTER — Encounter: Payer: Self-pay | Admitting: Specialist

## 2022-02-21 ENCOUNTER — Encounter: Payer: Self-pay | Admitting: Oncology

## 2022-02-21 DIAGNOSIS — R160 Hepatomegaly, not elsewhere classified: Secondary | ICD-10-CM | POA: Diagnosis not present

## 2022-02-21 DIAGNOSIS — K8689 Other specified diseases of pancreas: Secondary | ICD-10-CM | POA: Diagnosis not present

## 2022-02-21 DIAGNOSIS — R911 Solitary pulmonary nodule: Secondary | ICD-10-CM | POA: Diagnosis not present

## 2022-02-24 ENCOUNTER — Inpatient Hospital Stay: Payer: Medicare HMO

## 2022-02-24 ENCOUNTER — Encounter: Payer: Self-pay | Admitting: Interventional Radiology

## 2022-02-24 ENCOUNTER — Inpatient Hospital Stay: Payer: Medicare HMO | Admitting: Hematology and Oncology

## 2022-02-24 ENCOUNTER — Ambulatory Visit: Payer: Medicare HMO

## 2022-02-25 ENCOUNTER — Ambulatory Visit: Payer: Medicare HMO

## 2022-02-26 ENCOUNTER — Ambulatory Visit: Payer: Medicare HMO | Admitting: Oncology

## 2022-02-26 ENCOUNTER — Ambulatory Visit: Payer: Medicare HMO

## 2022-02-27 ENCOUNTER — Ambulatory Visit: Payer: Medicare HMO

## 2022-02-27 ENCOUNTER — Encounter: Payer: Self-pay | Admitting: Oncology

## 2022-02-27 DIAGNOSIS — C801 Malignant (primary) neoplasm, unspecified: Secondary | ICD-10-CM | POA: Insufficient documentation

## 2022-02-28 ENCOUNTER — Ambulatory Visit: Payer: Medicare HMO | Admitting: Oncology

## 2022-02-28 ENCOUNTER — Ambulatory Visit: Payer: Medicare HMO

## 2022-03-05 ENCOUNTER — Ambulatory Visit: Payer: Medicare HMO | Admitting: Oncology

## 2022-03-05 ENCOUNTER — Other Ambulatory Visit: Payer: Medicare HMO

## 2022-03-06 DEATH — deceased

## 2022-12-05 IMAGING — CT CT HEAD CODE STROKE
3 series · 16 of 47 positions shown, 19 images · non-contrast
Comparison: 02/20/2021

CLINICAL DATA: Code stroke.  Weakness, slurred speech

EXAM:
CT HEAD WITHOUT CONTRAST
TECHNIQUE: Contiguous axial images were obtained from the base of the skull
through the vertex without intravenous contrast.

[Series 2: head 5.0 st · axial · 0.41mm/px · z∈[-204,-64]mm · 10 of 34 slices shown, 13 images]
[im 3/34  brain]
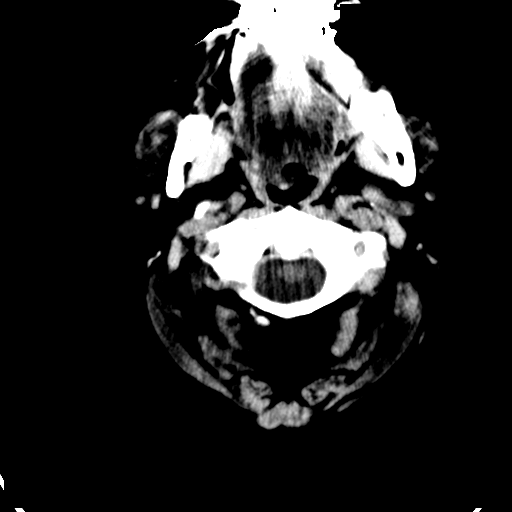
[im 3/34  bone]
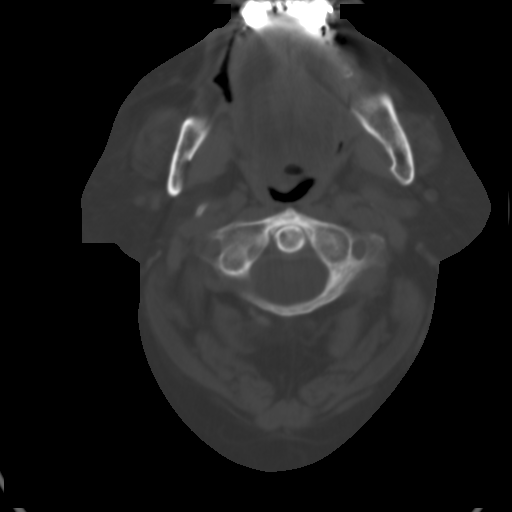
[im 6/34  brain]
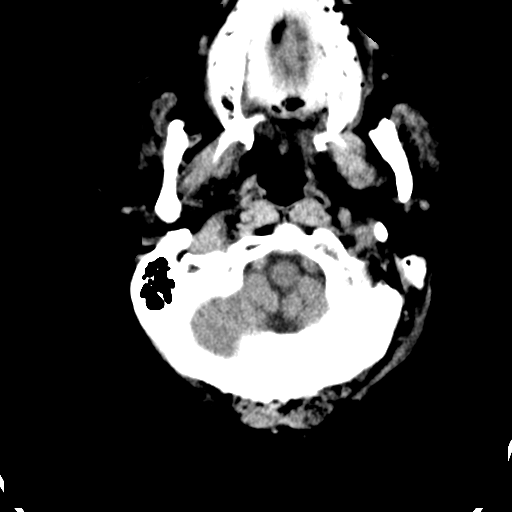
[im 10/34  brain]
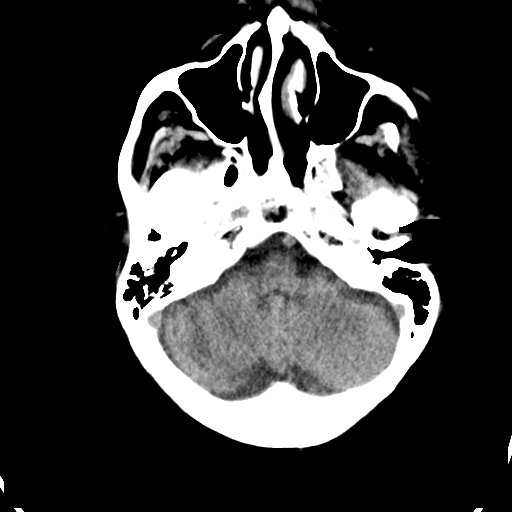
[im 12/34  brain]
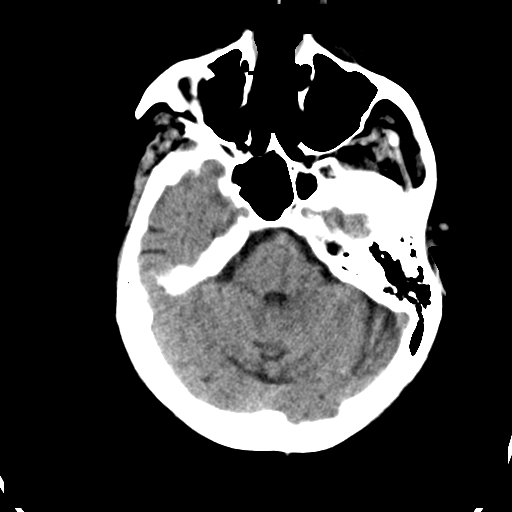
[im 15/34  brain]
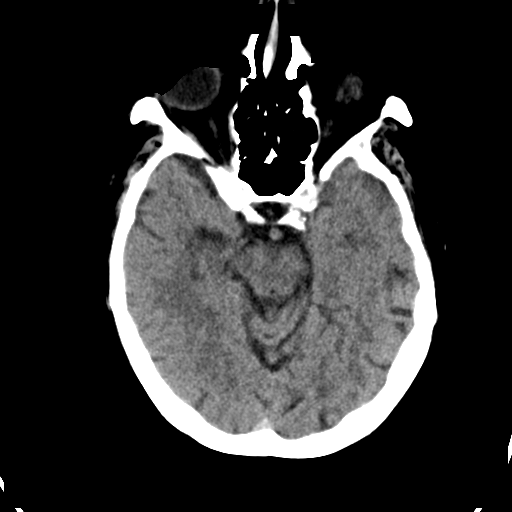
[im 15/34  bone]
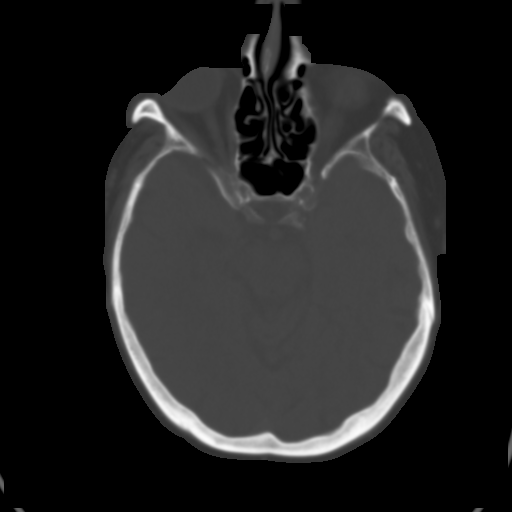
[im 19/34  brain]
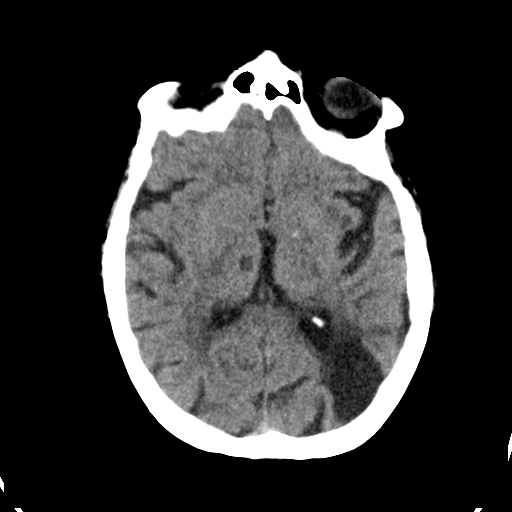
[im 22/34  brain]
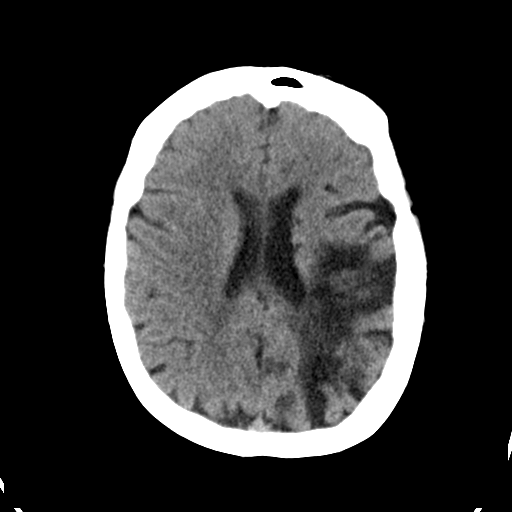
[im 26/34  brain]
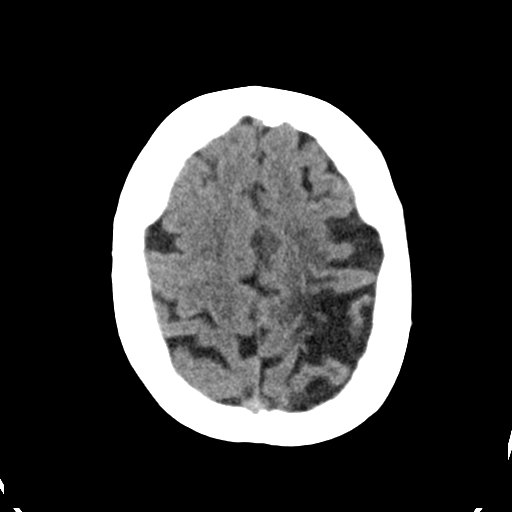
[im 28/34  brain]
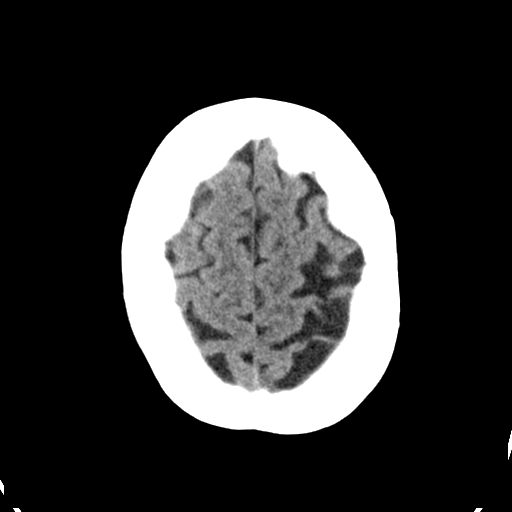
[im 28/34  bone]
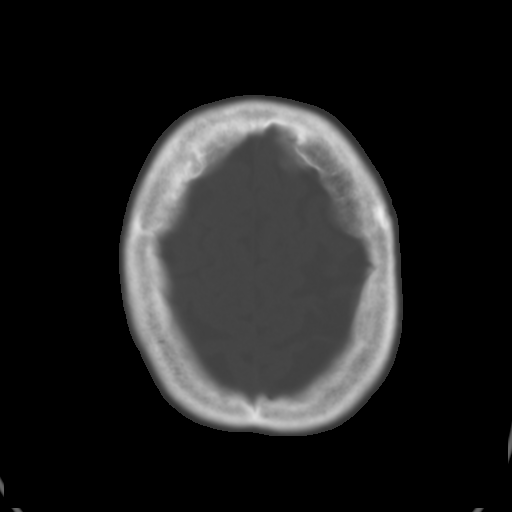
[im 31/34  brain]
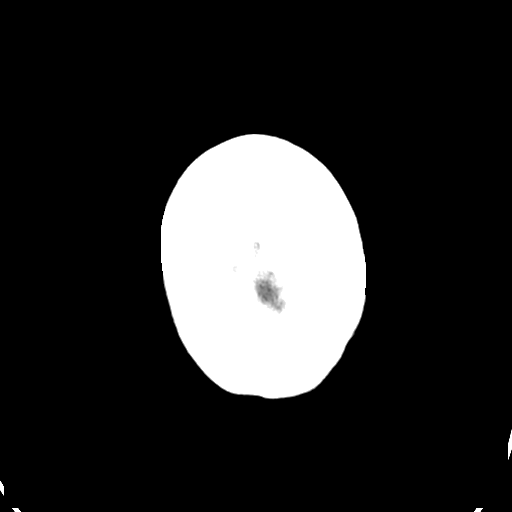

[Series 5: head 3.0 cor st · coronal · 0.30mm/px · 3 of 72 slices shown]
[im 24/72  brain]
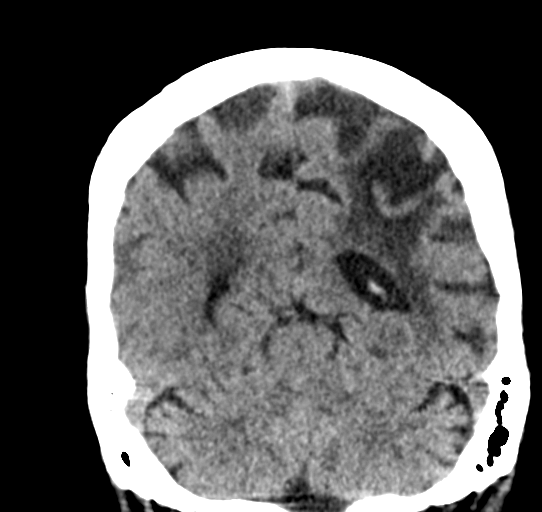
[im 32/72  brain]
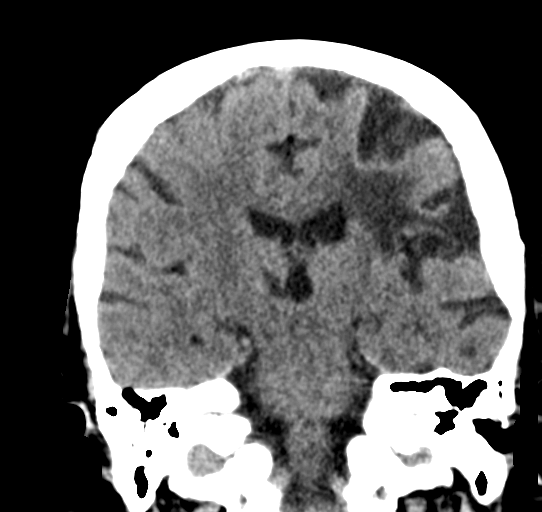
[im 40/72  brain]
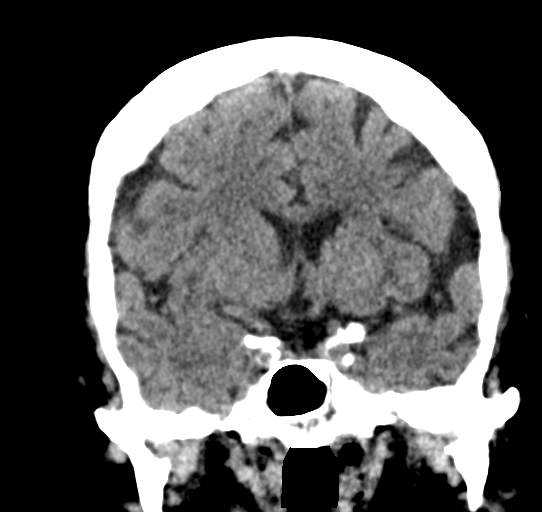

[Series 6: head 3.0 sag st · sagittal · 0.33mm/px · 3 of 67 slices shown]
[im 23/67  brain]
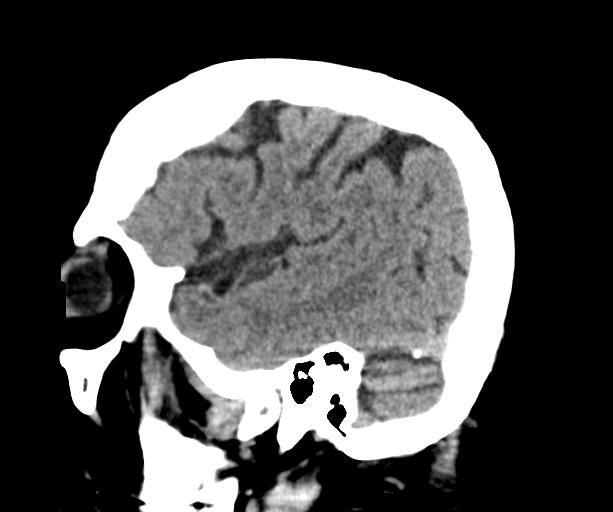
[im 34/67  brain]
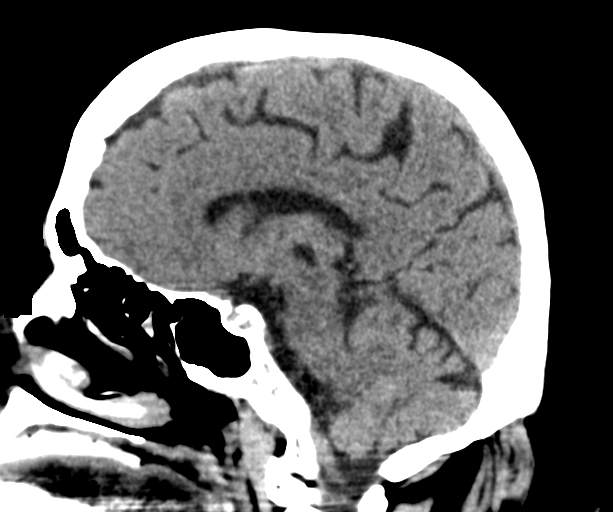
[im 45/67  brain]
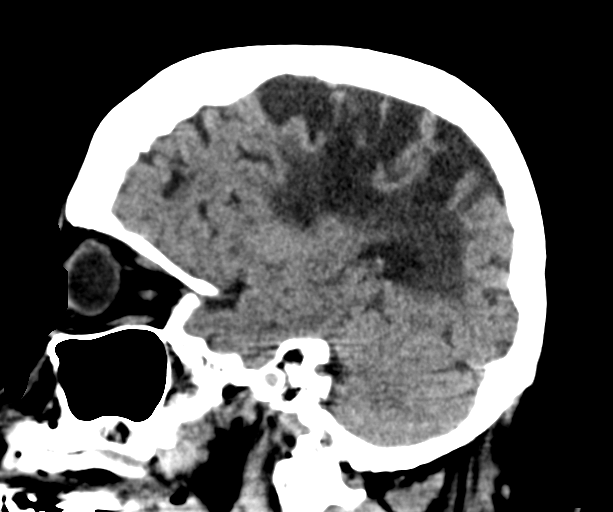

[16 of 47 positions shown; findings below may reference images not displayed]

FINDINGS: Brain: There is no acute intracranial hemorrhage, mass effect, or
edema. No new loss of gray-white differentiation. Chronic infarcts
of the left MCA territory are again identified. Chronic right
thalamic infarct is also again noted. Stable few additional findings
of probable chronic microvascular ischemic changes. Ventricles are
stable in size.

Vascular: No hyperdense vessel. There is intracranial
atherosclerotic calcification at the skull base.

Skull: Unremarkable.

Sinuses/Orbits: No acute abnormality.

Other: Mastoid air cells are clear.
IMPRESSION: No acute intracranial hemorrhage or evidence of acute infarction. No
change since 02/20/2021.

These results were communicated to Dr. Simmax at [DATE] on
02/26/2021 by text page via the [employer] messaging system.
